# Patient Record
Sex: Female | Born: 1980
Health system: Southern US, Community
[De-identification: ages and names within clinical notes are randomized; demographics above are authoritative.]

## PROBLEM LIST (undated history)

## (undated) DIAGNOSIS — L509 Urticaria, unspecified: Secondary | ICD-10-CM

## (undated) DIAGNOSIS — I639 Cerebral infarction, unspecified: Secondary | ICD-10-CM

## (undated) DIAGNOSIS — T783XXA Angioneurotic edema, initial encounter: Secondary | ICD-10-CM

## (undated) HISTORY — DX: Urticaria, unspecified: L50.9

## (undated) HISTORY — DX: Cerebral infarction, unspecified: I63.9

## (undated) HISTORY — DX: Angioneurotic edema, initial encounter: T78.3XXA

## (undated) HISTORY — PX: BRAIN SURGERY: SHX531

---
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
## 2000-10-08 ENCOUNTER — Other Ambulatory Visit: Admission: RE | Admit: 2000-10-08 | Discharge: 2000-10-08 | Payer: Self-pay | Admitting: Obstetrics and Gynecology

## 2000-11-21 ENCOUNTER — Encounter: Payer: Self-pay | Admitting: Obstetrics and Gynecology

## 2000-11-21 ENCOUNTER — Ambulatory Visit (HOSPITAL_COMMUNITY): Admission: RE | Admit: 2000-11-21 | Discharge: 2000-11-21 | Payer: Self-pay | Admitting: Obstetrics and Gynecology

## 2001-01-06 ENCOUNTER — Other Ambulatory Visit: Admission: RE | Admit: 2001-01-06 | Discharge: 2001-01-06 | Payer: Self-pay | Admitting: Obstetrics and Gynecology

## 2001-04-14 ENCOUNTER — Other Ambulatory Visit: Admission: RE | Admit: 2001-04-14 | Discharge: 2001-04-14 | Payer: Self-pay | Admitting: Internal Medicine

## 2001-11-05 ENCOUNTER — Other Ambulatory Visit: Admission: RE | Admit: 2001-11-05 | Discharge: 2001-11-05 | Payer: Self-pay | Admitting: Obstetrics and Gynecology

## 2004-04-30 ENCOUNTER — Ambulatory Visit: Payer: Self-pay | Admitting: Physical Medicine & Rehabilitation

## 2004-04-30 ENCOUNTER — Inpatient Hospital Stay (HOSPITAL_COMMUNITY): Admission: AC | Admit: 2004-04-30 | Discharge: 2004-05-17 | Payer: Self-pay

## 2004-05-01 ENCOUNTER — Encounter (INDEPENDENT_AMBULATORY_CARE_PROVIDER_SITE_OTHER): Payer: Self-pay | Admitting: Cardiology

## 2004-05-08 ENCOUNTER — Encounter: Payer: Self-pay | Admitting: Cardiology

## 2004-05-17 ENCOUNTER — Inpatient Hospital Stay (HOSPITAL_COMMUNITY)
Admission: RE | Admit: 2004-05-17 | Discharge: 2004-05-25 | Payer: Self-pay | Admitting: Physical Medicine & Rehabilitation

## 2004-05-17 ENCOUNTER — Ambulatory Visit: Payer: Self-pay | Admitting: Physical Medicine & Rehabilitation

## 2004-05-28 ENCOUNTER — Encounter
Admission: RE | Admit: 2004-05-28 | Discharge: 2004-08-26 | Payer: Self-pay | Admitting: Physical Medicine & Rehabilitation

## 2004-07-03 ENCOUNTER — Encounter
Admission: RE | Admit: 2004-07-03 | Discharge: 2004-10-01 | Payer: Self-pay | Admitting: Physical Medicine & Rehabilitation

## 2004-07-09 ENCOUNTER — Ambulatory Visit: Payer: Self-pay | Admitting: Physical Medicine & Rehabilitation

## 2004-08-27 ENCOUNTER — Encounter
Admission: RE | Admit: 2004-08-27 | Discharge: 2004-11-25 | Payer: Self-pay | Admitting: Physical Medicine & Rehabilitation

## 2005-01-16 ENCOUNTER — Other Ambulatory Visit: Admission: RE | Admit: 2005-01-16 | Discharge: 2005-01-16 | Payer: Self-pay | Admitting: Obstetrics and Gynecology

## 2005-07-25 ENCOUNTER — Ambulatory Visit (HOSPITAL_COMMUNITY): Admission: RE | Admit: 2005-07-25 | Discharge: 2005-07-25 | Payer: Self-pay | Admitting: Obstetrics and Gynecology

## 2005-07-29 ENCOUNTER — Inpatient Hospital Stay (HOSPITAL_COMMUNITY): Admission: AD | Admit: 2005-07-29 | Discharge: 2005-07-29 | Payer: Self-pay | Admitting: Obstetrics and Gynecology

## 2005-07-31 ENCOUNTER — Inpatient Hospital Stay (HOSPITAL_COMMUNITY): Admission: AD | Admit: 2005-07-31 | Discharge: 2005-08-05 | Payer: Self-pay | Admitting: Obstetrics and Gynecology

## 2005-08-01 ENCOUNTER — Encounter (INDEPENDENT_AMBULATORY_CARE_PROVIDER_SITE_OTHER): Payer: Self-pay | Admitting: Specialist

## 2007-02-05 ENCOUNTER — Observation Stay (HOSPITAL_COMMUNITY): Admission: AD | Admit: 2007-02-05 | Discharge: 2007-02-06 | Payer: Self-pay | Admitting: Obstetrics and Gynecology

## 2007-02-23 ENCOUNTER — Encounter (INDEPENDENT_AMBULATORY_CARE_PROVIDER_SITE_OTHER): Payer: Self-pay | Admitting: Obstetrics and Gynecology

## 2007-02-23 ENCOUNTER — Inpatient Hospital Stay (HOSPITAL_COMMUNITY): Admission: RE | Admit: 2007-02-23 | Discharge: 2007-02-26 | Payer: Self-pay | Admitting: Obstetrics and Gynecology

## 2009-04-28 IMAGING — US US OB COMP +14 WK
1 series · 14 of 23 positions shown · non-contrast
Comparison: none

OBSTETRICAL ULTRASOUND:

 This ultrasound exam was performed in the [HOSPITAL] Ultrasound Department.  The OB US report was generated in the AS system, and faxed to the ordering physician.  This report is also available in [REDACTED] PACS.

[Series 1: us ob comp +14 wk · 0.30mm/px · 14 of 23 slices shown]
[im 1/23]
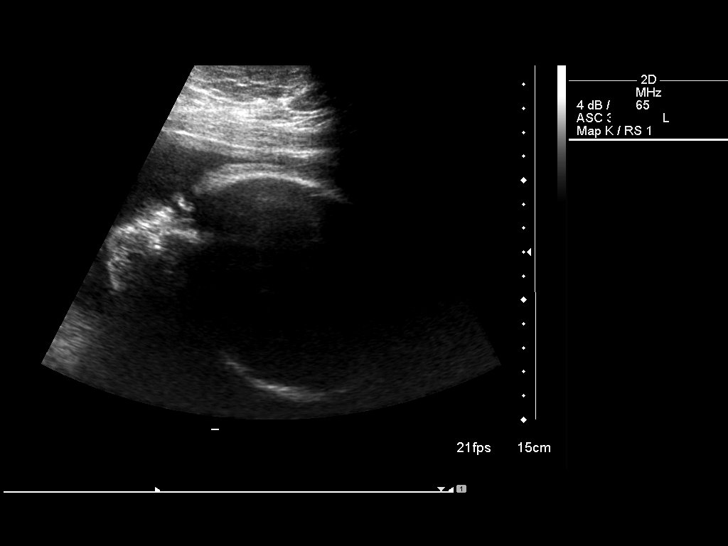
[im 3/23]
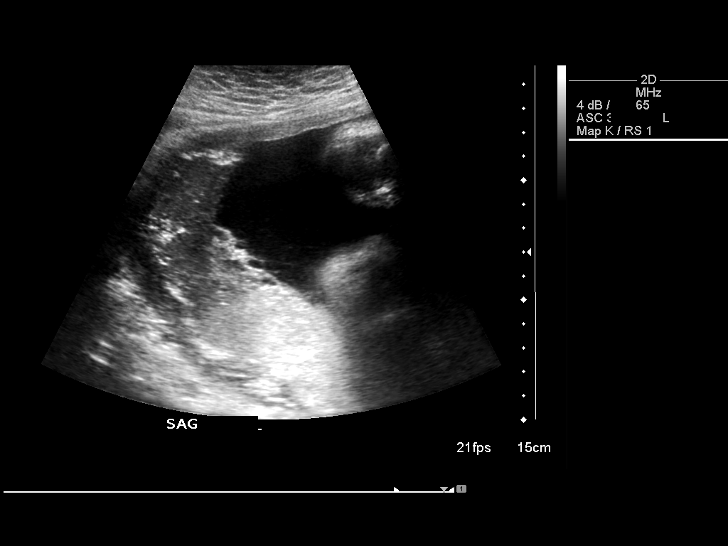
[im 5/23]
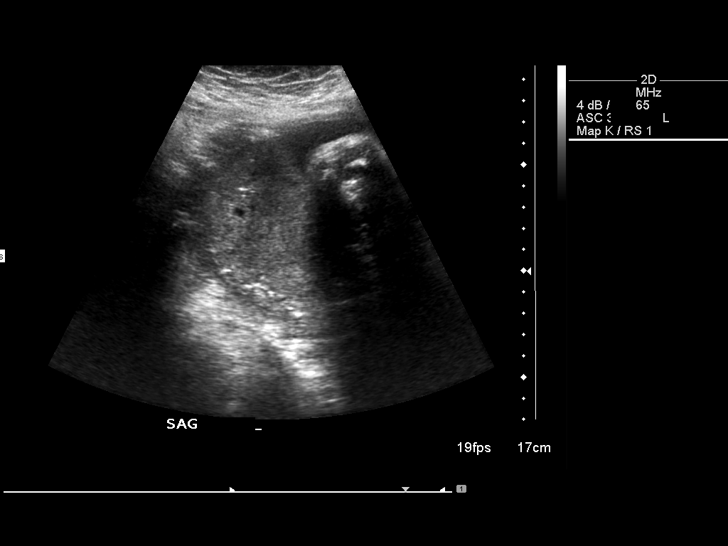
[im 6/23]
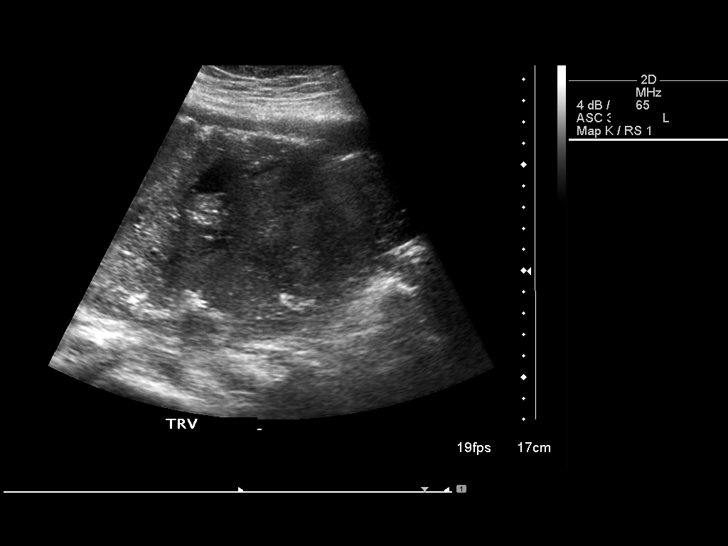
[im 8/23]
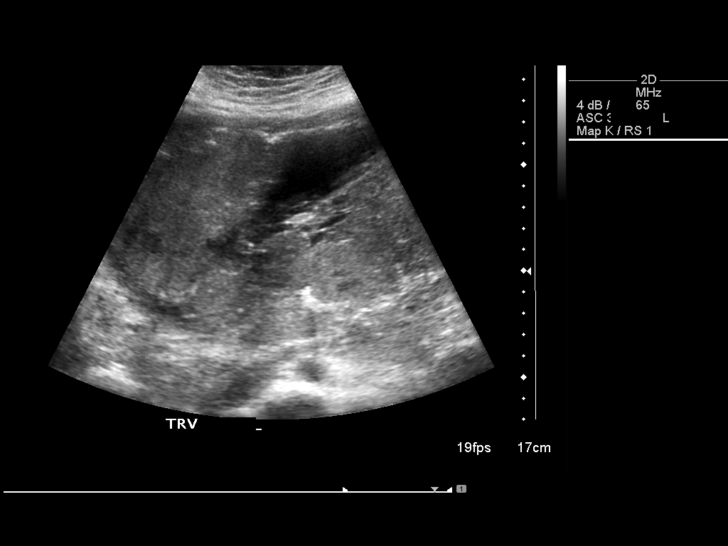
[im 10/23]
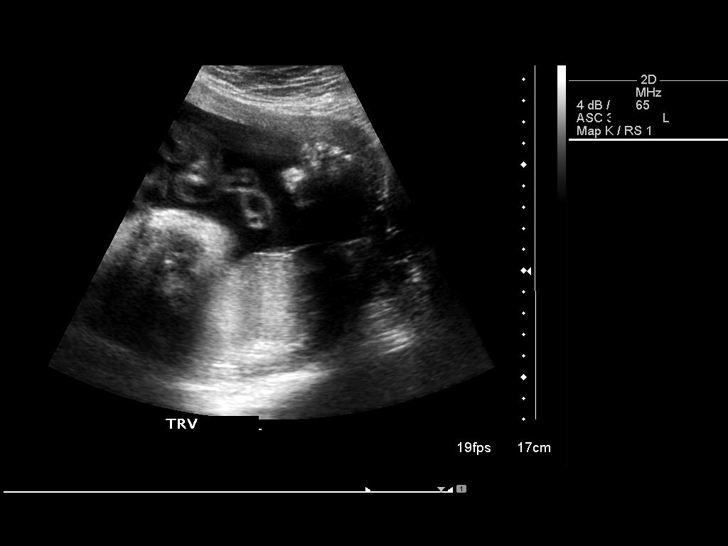
[im 11/23]
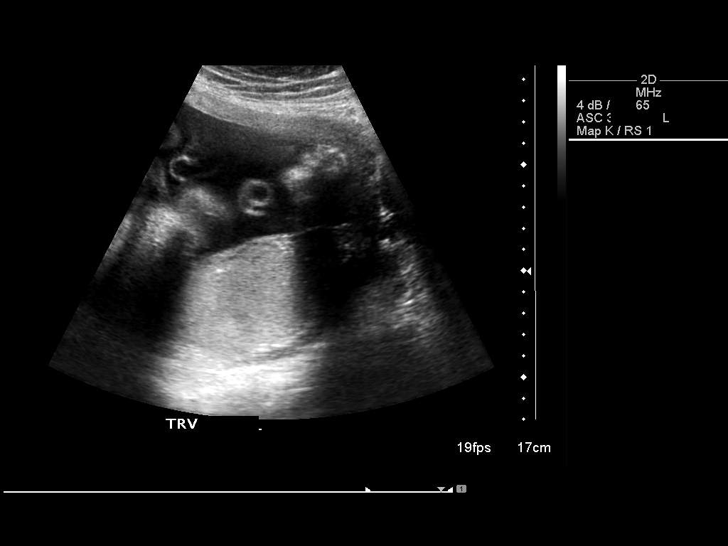
[im 13/23]
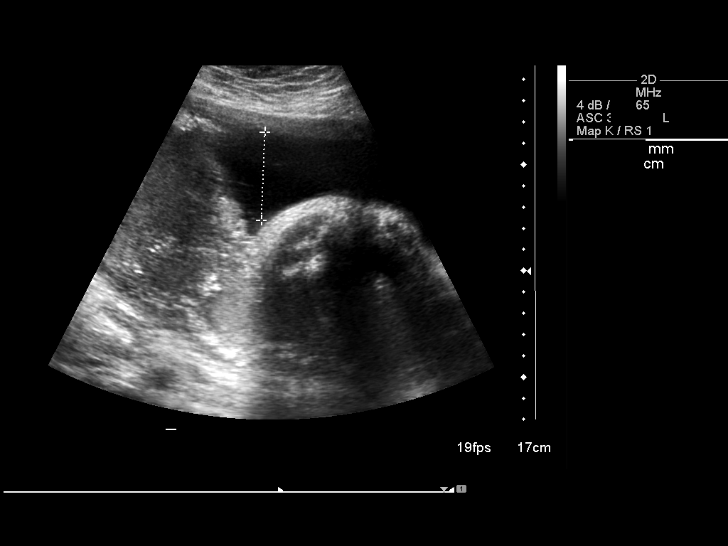
[im 14/23]
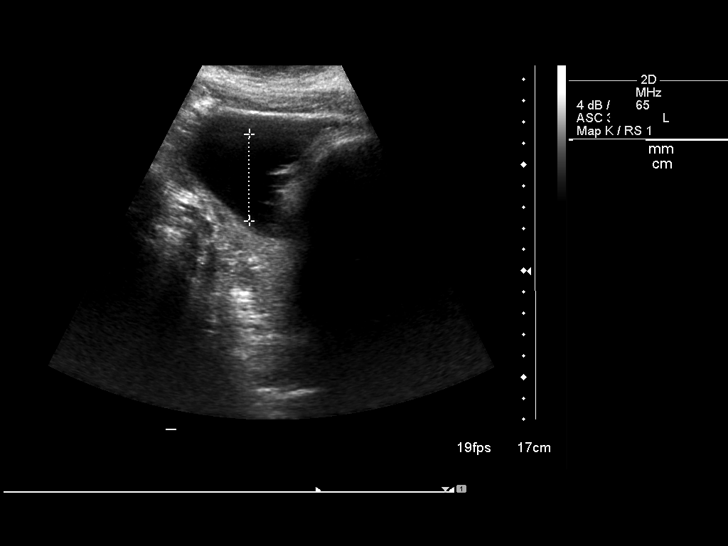
[im 16/23]
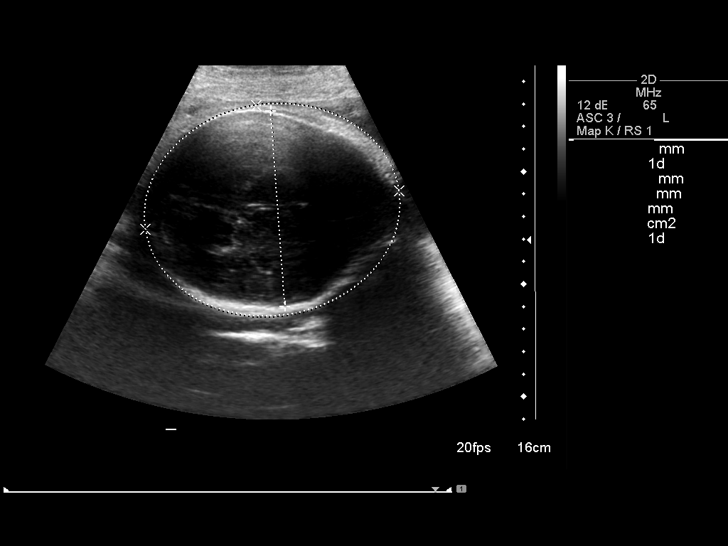
[im 18/23]
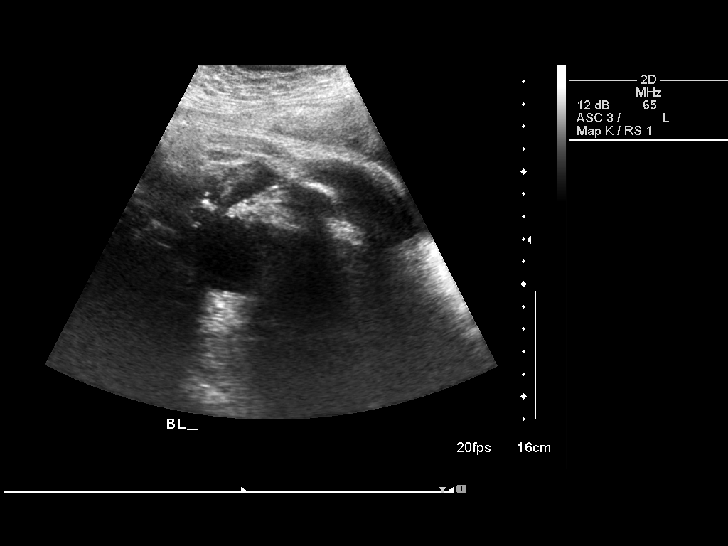
[im 19/23]
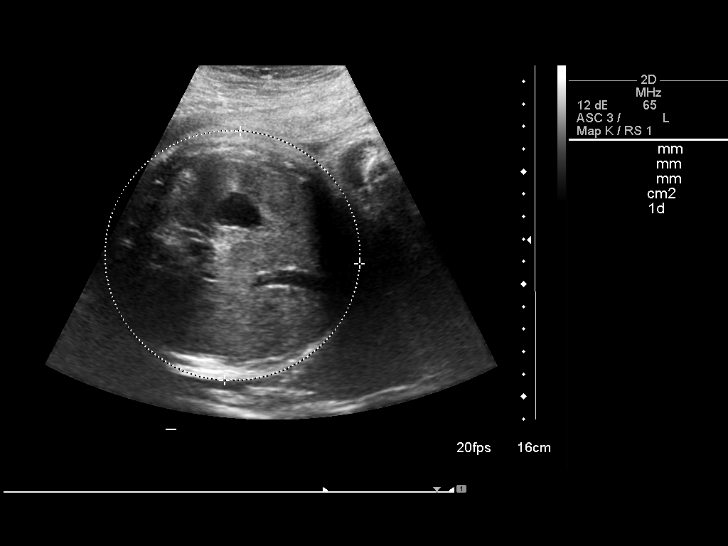
[im 21/23]
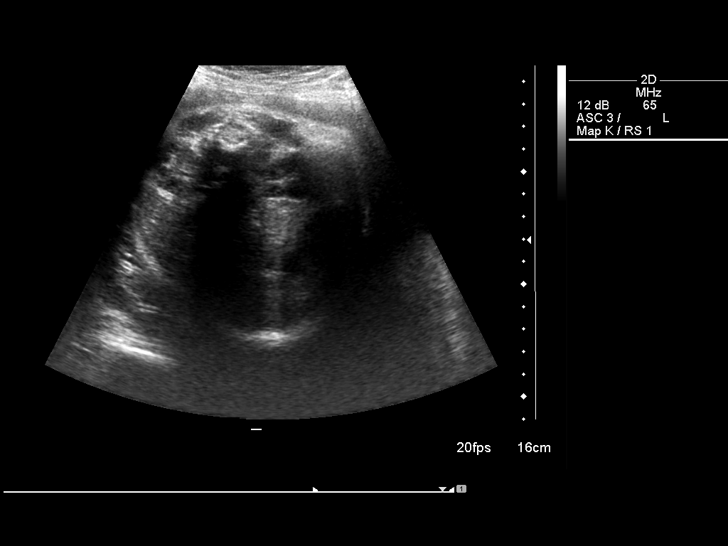
[im 23/23]
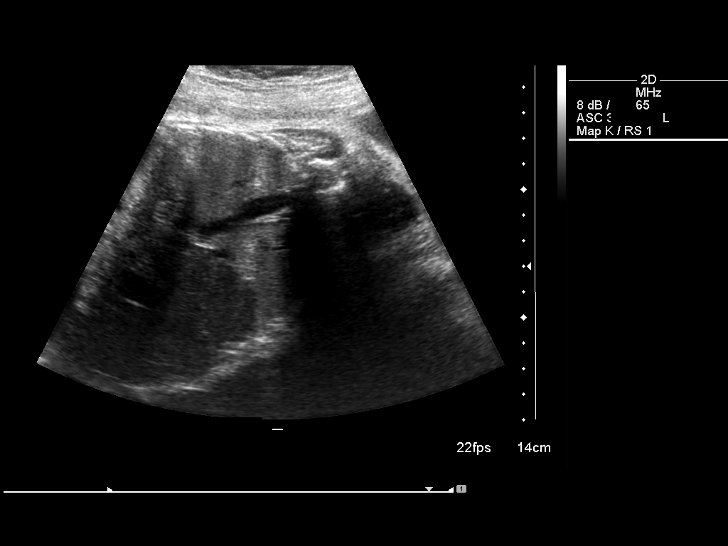

[14 of 23 positions shown; findings below may reference images not displayed]

IMPRESSION: See AS Obstetric US report.

## 2010-09-02 ENCOUNTER — Encounter: Payer: Self-pay | Admitting: Neurosurgery
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
## 2010-12-28 NOTE — Discharge Summary (Signed)
[name], [name]               ACCOUNT NO.:  [id number]   MEDICAL RECORD NO.:  [id number]          PATIENT TYPE:  INP   LOCATION:  9136                          [facility]ACILITY[facility]   PHYSICIAN:  Malachi Pro. Ambrose Mantle, M.D. DATE OF BIRTH:  1981-03-28   DATE OF ADMISSION:  02/23/2007  DATE OF DISCHARGE:  02/26/2007                               DISCHARGE SUMMARY   This is a 30 year old white married female para 1-0-1-1 gravida 3, [facility]  February 28, 2007 admitted for repeat C-section and tubal ligation.  Blood  group and type O+, negative antibody, nonreactive serology, rubella  immune, hepatitis B surface antigen negative, HIV negative, GC and  chlamydia negative.  Pap smear normal.  1-hour Glucola 78.   HOSPITAL COURSE:  The patient underwent a low transverse cervical C-  section and tubal ligation on February 23, 2007.  Postoperatively she did  well, she ambulated well without difficulty, voided well, remained  afebrile, tolerated a diet and on the third postop day she was ready for  discharge. Because her blood pressure remained on the day of discharge  in the 140s over 90s range a PIH panel was done, the blood work is  completely normal with respect a platelet count LDH, SGOT and SGPT. We  are waiting on the cath urine free for protein to decide if she has  preeclampsia or just pregnancy-induced high blood pressure.  Initial  hemoglobin 11.6, hematocrit 35.2, white count 10,900, platelet count  216,000.  Follow-up hemoglobin 9.2. On the day of discharge the  hemoglobin is stable. The PIH panel on the day of discharge as stated  earlier is normal. RPR is nonreactive and rapid HIV screen is negative.   FINAL DIAGNOSIS:  Intrauterine pregnancy at term delivered vertex by C-  section. The delivery was done with vacuum assistance and the vacuum was  actually partially over the baby's forehead. Voluntary sterilization.   OPERATION:  Low transverse cervical C-section with vacuum assisted  delivery of  the fetal vertex, bilateral tubal ligation.   FINAL CONDITION:  Improved.  Instructions include our regular discharge  instruction booklet.  The patient is advised to call with any severe  headache or any significant problems.  Darvocet-N 100 24 tablets one  every 4-6 hours needed for pain is given at discharge. She is to return  in 7-10 days for follow-up examination.      Malachi Pro. Ambrose Mantle, M.D.  Electronically Signed     TFH/MEDQ  D:  02/26/2007  T:  02/26/2007  Job:  [id number]
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
## 2010-12-28 NOTE — Op Note (Signed)
NAMEJAICE, LAGUE               ACCOUNT NO.:  [id number]   MEDICAL RECORD NO.:  [id number]          PATIENT TYPE:  INP   LOCATION:  9136                          FACILITY:  [facility]   PHYSICIAN:  Malachi Pro. Ambrose Mantle, M.D. DATE OF BIRTH:  Jul 30, 1981   DATE OF PROCEDURE:  02/23/2007  DATE OF DISCHARGE:                               OPERATIVE REPORT   PREOPERATIVE DIAGNOSIS:  Intrauterine pregnancy 39 weeks prior C-  section, declined VBAC, voluntary sterilization.   POSTOPERATIVE DIAGNOSIS:  Intrauterine pregnancy 39 weeks prior C-  section, declined VBAC, voluntary sterilization.  With dense adhesions  of the uterus to the lower anterior abdominal wall.  No anterior cul-de-  sac present.   OPERATION:  Low-transverse cervical C-section, lysis of adhesions,  bilateral tubal ligation.   OPERATOR:  Dr. Ambrose Mantle   ASSISTANT:  Dr. Senaida Ores   ANESTHESIA:  Spinal anesthesia   DESCRIPTION OF PROCEDURE:  The patient was brought to the operating room  and given a spinal anesthetic by Dr. Tacy Dura, placed in the left lateral  tilt position.  Fetal heart tones were normal.  The abdomen was prepped  with Betadine solution and was pulled up to the ether screen with tape  to aid in visualization. The urethra was prepped with a Foley catheter  was inserted to straight drain.  The abdomen was draped as a sterile  field. After anesthesia was confirmed by pinching the lower abdomen with  Allis clamp.  A low transverse incision was made through the old scar  and carried in layers through the thick abdominal wall to the fascia.  The fascia was then incised transversely and separated from the rectus  muscle as well as could be even though there was significant scarring  present, then identified the midline and entered the peritoneal cavity  at which time I noted that there were dense adhesions both on the right  and the left lower uterine segment to the abdominal wall.  These were  divided mainly by  electrical current and this aided in visualization of  the lower uterine segment. A retractor was used to visualize the lower  uterine segment, incision was made into the myometrium and then with my  finger I was able to get into the amniotic sac.  Clear fluid was  obtained, a large amount of clear fluid was present.  The incision was  enlarged transversely with bandage scissors and then again with my  fingers the vertex was well below the incision but did not come up  through the incisional opening well. After it was seen that we were  unable to deliver the baby in the routine manner.  I placed a mushroom  vacuum on the baby's head and this popped off.  We then placed the  vacuum.  The self controlled kiwi vacuum and Dr. Senaida Ores enlarged the  incision by cutting part of the left rectus muscle.  The baby did come  through the incisional opening and it was apparent that the vacuum had  actually been placed on the upper part of the baby's left forehead and  the infant delivered  without any difficulty.  The cord was clamped and  the infant was given to Dr. Venetia Constable who was in attendance and she  assigned the 8 pounds 5 ounces female infant Apgars of 8 at 1 and 9 at 5  minutes.  The placenta was removed, the uterine cervix was dilated with  a ring forceps.  The inside of the uterus felt to be free of debris and  the uterine incision was closed with two running sutures of 0 Vicryl  locking the first layer, nonlocking on the second layer. Both tubes and  ovaries appeared normal as did the uterus and additional suture of 3-0  Vicryl was used for complete hemostasis on the uterine incision.  A  small incision was made in the mesosalpinx bilaterally two ties of 0  plain catgut were placed proximally and distally on each tube and the  segment of tube intervening was removed.  There was no bleeding.  The  uterus was replaced into the abdominal cavity.  We sought for  hemostasis.  I inspected the  bladder very carefully to make sure that  dividing the adhesions had not caused any injury to the bladder and I  saw none. I then closed the abdominal wall with a running suture of 0  Vicryl on the peritoneum. A couple of interrupted sutures on the rectus  muscle.  I was not able to reconnect the left rectus muscle that had  been cut.  I then closed the fascia with two running sutures of 0  Vicryl. The exact limits of the fascia on the left were not clear so I  closed what I thought was the fascia and I imbricated an layer of what  was probably scar tissue above that. The subcu tissue was closed with a  3-0 Vicryl.  Skin was closed with automatic staples and the patient was  returned to recovery in satisfactory condition.  Blood loss was about  1000 mL.  Urine was clear.      Malachi Pro. Ambrose Mantle, M.D.  Electronically Signed     TFH/MEDQ  D:  02/23/2007  T:  02/23/2007  Job:  [id number]
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
## 2010-12-28 NOTE — Op Note (Signed)
[name], [name]               ACCOUNT NO.:  [id number]   MEDICAL RECORD NO.:  [id number]          PATIENT TYPE:  INP   LOCATION:  9111                          [facility]   PHYSICIAN:  Huel Cote, M.D. DATE OF BIRTH:  1981/05/20   DATE OF PROCEDURE:  08/01/2005  DATE OF DISCHARGE:                                 OPERATIVE REPORT   PREOPERATIVE DIAGNOSIS:  1.  Term pregnancy at 40+ weeks, delivered.  2.  Pregnancy induced hypertension.  3.  Suspected macrosomia.  4.  Arrest of dilation.   POSTOPERATIVE DIAGNOSIS:  1.  Term pregnancy at 40+ weeks, delivered.  2.  Pregnancy induced hypertension.  3.  Suspected macrosomia.  4.  Arrest of dilation.   PROCEDURE:  Primary low transverse cesarean section with double layer  closure.   SURGEON:  Dr. Huel Cote.   ASSISTANT:  Dr. Tracey Harries.   ANESTHESIA:  Epidural.   FINDINGS:  There is a viable female infant in the vertex presentation Apgars  were 08/09. Weight was pending at time of dictation. There was meconium-  stained fluid and a nuchal cord x1 on the infant. Infant was DeLee  suctioned. The uterus, tubes and ovaries were normal.   PROCEDURE:  The patient was taken to the operating room where epidural  anesthesia was initially found to be inadequate on the left side. Therefore  after pulling back catheter and redosing the patient, she did achieve an  adequate epidural anesthetic. 1% lidocaine was also injected into the skin  for additional anesthesia. The scalpel was then used to make a Pfannenstiel  incision and this was carried through to the underlying layer of fascia by  sharp dissection and Bovie cautery. The peritoneum was then taken down to  level of fascia by sharp dissection and Bovie cautery. The fascia was then  nicked in the midline and the incision was extended laterally with Mayo  scissors. The inferior aspect of the incision was then grasped with Kocher  clamps, elevated and dissected  off the rectus muscles. The superior aspect  was likewise elevated and dissected off the rectus muscles. Rectus muscles  were separated in midline and the incision was extended both superiorly and  inferiorly once the peritoneal cavity had been entered bluntly. There was a  significant amount of peritoneal edema noted. The large wound retractor self-  retaining retractor was then placed within the incision and the skin and  fascia retracted. The lower uterine segment was then exposed nicely. This  was incised in transverse fashion with the scalpel and the cavity itself was  entered bluntly. There was moderate meconium-stained fluid noted and the  infant's head was then delivered atraumatically. The infant was both DeLee  suctioned and bulb suctioned after delivery of the head. There was a nuchal  cord x1 which was reduced. The remainder of the infant's body delivered  atraumatically. The cord was clamped, cut and infant was handed to the  waiting pediatricians. The patient's cord blood was then obtained and the  placenta sent for cord blood harvesting and then to go to pathology as the  amniotic fluid did appear somewhat foul-smelling. The uterus was then  evacuated of all clots and debris with moist lap sponge. It was slightly  boggy but this responded well to bimanual massage and Pitocin. The uterine  incision was then closed in two layers. The first a running locked layer of  0 chromic. A second an imbricating layer of the same. At this point there  was good hemostasis noted. The ovaries and tubes were inspected and found be  hemostatic. The incision was hemostatic. The gutters cleared of all clots,  debris with moist lap sponge and all instruments and sponges were then  removed from the abdomen. The bladder flap was inspected and though quite  edematous, it did not appear that the bladder approached level of the  incision. The patient did have bloody urine noted. However, this had been   present in the labor and delivery room prior to C-section. At this point Dr.  Ambrose Mantle finished the closure. He put several interrupted sutures of 0 Vicryl  into the rectus muscle itself. Fascia was then closed with 0 Vicryl in  running fashion and the skin was closed with staples. Sponge, lap and needle  counts were correct x2 and the patient was taken to the recovery room in  stable condition. Estimated blood loss was essentially 1000 mL and the urine  will be watched for clearing.      Huel Cote, M.D.  Electronically Signed     KR/MEDQ  D:  08/01/2005  T:  08/02/2005  Job:  [id number]
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
## 2010-12-28 NOTE — Discharge Summary (Signed)
[name], OTTAWAY               ACCOUNT NO.:  [id number]   MEDICAL RECORD NO.:  [id number]          PATIENT TYPE:  INP   LOCATION:  9111                          FACILITY:  [facility]   PHYSICIAN:  Malachi Pro. Ambrose Mantle, M.D. DATE OF BIRTH:  1981-06-21   DATE OF ADMISSION:  07/31/2005  DATE OF DISCHARGE:  08/05/2005                                 DISCHARGE SUMMARY   A 30 year old white female, para 0-0-1-0, gravida 2, blood group and type O  positive, negative antibody, non-reactive serology, rubella non-immune,  hepatitis B surface antigen negative, HIV negative, GC and chlamydia  negative, group B strep negative, abnormal Pap smear, and one-hour Glucola  100.  The patient was admitted.  Her prenatal course is as stated in the  history and physical.  The patient was admitted in early labor.  She was 3  to 4 cm at 10:45 p.m. on July 31, 2005.  By midnight, she was 4 to 5 cm.  By 6:10 a.m., she was 8 cm.  By 8:30 a.m., she was a rim dilated.  She  underwent a low transverse cervical C-section by Dr. Senaida Ores with Dr.  Ambrose Mantle assisting, under epidural anesthesia, with a female infant weighing 8  pounds 8 ounces, with Apgars of 6 at one minute and 9 at five minutes.  Blood loss about 1000 mL.  Postoperatively, the patient had two main  problems:  elevated temperature and intermittent elevation of blood  pressure.  Dr. Senaida Ores felt she had residual endometritis and  chorioamnionitis.  She was on clindamycin.  On the second postop day, she  was kept on clindamycin and then she did develop a temperature elevation to  100.7 on the evening of the second postop day.  Dr. Jackelyn Knife was not  notified.  When he noticed this on the morning of the third postop day, he  added gentamycin, and on the forth postop day she had been afebrile for 36  hours.  Her blood pressures continued to be intermittently elevated, but no  worse.  The white count had decreased to 14,100.  The patient was ambulating  well without difficulty, voiding well.  She had had a bowel movement.  Her  incision was well healed.  Staples removed.  Strips were applied and she was  ready for discharge.  Multiple PIH panels showed hemoglobin of initial 11.9,  it drifted down to 8.9 at discharge.  Her white count on admission was  15,000, went as high as 26.3, on the morning of discharge was 14.1.  All  platelet counts were normal, all liver function tests were normal.  The only  abnormality that was consistent was a uric acid, which was 7.9, 8.9, and  8.3.  Her urinalysis on admission did show 100 mg% protein.  RPR was non-  reactive.   FINAL DIAGNOSES:  1.  Intrauterine pregnancy at 40+ weeks with relative cephalopelvic      disproportion.  2.  Pregnancy-induced hypertension versus preeclampsia.  3.  Chorioamnionitis.  4.  Endometritis.   OPERATION:  Low transverse cervical C-section.   FINAL CONDITION:  Improved.  INSTRUCTIONS:  Include our regular discharge instruction booklet.  The  patient is advised to call with any temperature elevation above 100.4  degrees.  She is to take her temperature four times a day.  She is to return  to the office in 10 days.  Darvocet N-100, 24 tablets, one every 4-6 hours  as needed for pain is given at discharge.      Malachi Pro. Ambrose Mantle, M.D.  Electronically Signed     TFH/MEDQ  D:  08/05/2005  T:  08/05/2005  Job:  [id number]

## 2011-05-27 LAB — CBC
HCT: 27.8 — ABNORMAL LOW
HCT: 30 — ABNORMAL LOW
MCHC: 33.2
MCV: 80.1
MCV: 80.3
Platelets: 176
Platelets: 257
RBC: 3.74 — ABNORMAL LOW
WBC: 11.7 — ABNORMAL HIGH

## 2011-05-27 LAB — URINALYSIS, ROUTINE W REFLEX MICROSCOPIC
Bilirubin Urine: NEGATIVE
Hgb urine dipstick: NEGATIVE
Ketones, ur: NEGATIVE
Nitrite: NEGATIVE
Specific Gravity, Urine: <1.005 — ABNORMAL LOW
Urobilinogen, UA: 0.2

## 2011-05-27 LAB — COMPREHENSIVE METABOLIC PANEL
ALT: 19
Alkaline Phosphatase: 125 — ABNORMAL HIGH
BUN: 5 — ABNORMAL LOW
CO2: 24
Chloride: 109
GFR calc non Af Amer: >60
Glucose, Bld: 80
Potassium: 3.9
Sodium: 139
Total Bilirubin: 0.8
Total Protein: 5.2 — ABNORMAL LOW

## 2011-05-27 LAB — URIC ACID: Uric Acid, Serum: 8 — ABNORMAL HIGH

## 2011-05-27 LAB — LACTATE DEHYDROGENASE: LDH: 225

## 2011-05-28 LAB — COMPREHENSIVE METABOLIC PANEL
ALT: 13
AST: 22
Albumin: 2.8 — ABNORMAL LOW
Alkaline Phosphatase: 200 — ABNORMAL HIGH
BUN: 6
Chloride: 106
Potassium: 4.1
Sodium: 137
Total Bilirubin: 0.9

## 2011-05-28 LAB — DIFFERENTIAL
Basophils Absolute: 0
Basophils Relative: 0
Eosinophils Absolute: 0
Eosinophils Relative: 0
Monocytes Absolute: 0.7
Neutro Abs: 8.4 — ABNORMAL HIGH

## 2011-05-28 LAB — CBC
MCHC: 32.9
MCV: 79.8
RBC: 4.41
RDW: 14.9 — ABNORMAL HIGH

## 2011-05-29 LAB — CBC
HCT: 34.7 — ABNORMAL LOW
MCHC: 32.8
MCV: 82.1
Platelets: 213
RDW: 14.7 — ABNORMAL HIGH

## 2011-05-29 LAB — RPR: RPR Ser Ql: NONREACTIVE

## 2012-02-04 ENCOUNTER — Emergency Department (HOSPITAL_COMMUNITY)
Admission: EM | Admit: 2012-02-04 | Discharge: 2012-02-04 | Disposition: A | Discharge status: Home / Self Care | Payer: BC Managed Care – PPO | Attending: Emergency Medicine | Admitting: Emergency Medicine

## 2012-02-04 ENCOUNTER — Encounter (HOSPITAL_COMMUNITY): Payer: Self-pay | Admitting: *Deleted

## 2012-02-04 ENCOUNTER — Emergency Department (HOSPITAL_COMMUNITY): Payer: BC Managed Care – PPO

## 2012-02-04 DIAGNOSIS — R51 Headache: Secondary | ICD-10-CM | POA: Insufficient documentation

## 2012-02-04 MED ORDER — KETOROLAC TROMETHAMINE 60 MG/2ML IM SOLN
INTRAMUSCULAR | Status: AC
  Administered 2012-02-04: 60 mg via INTRAMUSCULAR
  Filled 2012-02-04: qty 2

## 2012-02-04 MED ORDER — KETOROLAC TROMETHAMINE 60 MG/2ML IM SOLN
60.0000 mg | Freq: Once | INTRAMUSCULAR | Status: AC
  Administered 2012-02-04: 60 mg via INTRAMUSCULAR

## 2012-02-04 NOTE — ED Provider Notes (Signed)
History     CSN: [id number]  Arrival date & time 02/04/12  7829   First MD Initiated Contact with Patient 02/04/12 1008      Chief Complaint  Patient presents with  . Headache    (Consider location/radiation/quality/duration/timing/severity/associated sxs/prior treatment) The history is provided by the patient.  Pt is a 31 y/o F s/p TBI with craniectomy and cerebellar microdissection 8 years ago by Dr Lovell Sheehan who presents to ED with c/c HA x 3 days. Pt reports frequent similar HA since injury that typically resolve within an hour or so. Described as constant, sharp, with occasional spasms described as "shocks" to the right parietal/superior occipital region. There is no assoc visual change, tinnitus, trouble speaking, difficulty moving extremities, new gait instability (chronic unsteady gait since injury, unchanged). Denies fever, neck stiffness, rash. There has been no new head injury. She has taken tylenol without relief. Was seen by PCP yesterday with referral to see Dr Lovell Sheehan, which has not yet been processed.   History reviewed. No pertinent past medical history.  Past Surgical History  Procedure Date  . Brain surgery   . Cesarean section     No family history on file.  History  Substance Use Topics  . Smoking status: Never Smoker   . Smokeless tobacco: Not on file  . Alcohol Use: No     Review of Systems 10 systems reviewed and are negative for acute change except as noted in the HPI.  Allergies  Morphine and related and Vicodin  Home Medications   Current Outpatient Rx  Name Route Sig Dispense Refill  . ACETAMINOPHEN 325 MG PO TABS Oral Take 650 mg by mouth every 6 (six) hours as needed. For pain    . OVER THE COUNTER MEDICATION Oral Take 1 tablet by mouth daily.      BP 112/66  Pulse 67  Temp 98 F (36.7 C) (Oral)  Resp 18  Ht 5' (1.524 m)  Wt 140 lb (63.504 kg)  BMI 27.34 kg/m2  SpO2 100%  LMP 01/31/2012  Physical Exam  Nursing note  reviewed. Constitutional: She is oriented to person, place, and time. She appears well-developed and well-nourished. No distress.       Vital signs are reviewed and are normal.   HENT:  Head: Normocephalic and atraumatic.       Right parietal scalp tender to palpation  Eyes: Conjunctivae and EOM are normal. Pupils are equal, round, and reactive to light.       Visual fields full. No nystagmus.  Neck: Neck supple.       Full ROM without pain  Cardiovascular: Normal rate and regular rhythm.        Bilateral radial and DP pulses are 2+   Pulmonary/Chest: Effort normal and breath sounds normal. No respiratory distress.  Abdominal: She exhibits no distension.  Neurological: She is alert and oriented to person, place, and time. She has normal strength. No cranial nerve deficit (3-12 intact) or sensory deficit (intact to light touch in all extremities distally). She displays a negative Romberg sign. Coordination (F-N intact bilaterally) normal. GCS eye subscore is 4. GCS verbal subscore is 5. GCS motor subscore is 6.       Slight imbalance on gait testing, no ataxia- baseline per pt.  Skin: Skin is warm and dry. No rash noted.  Psychiatric: She has a normal mood and affect.    ED Course  Procedures (including critical care time)  Labs Reviewed - No data to display Ct  Head Wo Contrast  02/04/2012  *RADIOLOGY REPORT*  Clinical Data: Right sided headache, dizziness, prior brain surgery  CT HEAD WITHOUT CONTRAST  Technique:  Contiguous axial images were obtained from the base of the skull through the vertex without contrast.  Comparison: 05/16/2004  Findings: Encephalomalacic changes in the left cerebellum (series 2/image 8) and posterior right parietal lobe (series 2/image 18). Additional suspected prior infarct in the right cerebellum (series 2/image 5).  Postsurgical changes at the left skull base.  No evidence of parenchymal hemorrhage or extra-axial fluid collection. No mass lesion, mass effect,  or midline shift.  No CT evidence of acute infarction.  Cerebral volume is age appropriate.  No ventriculomegaly.  Septum cavum pellucidum.  The visualized paranasal sinuses are essentially clear. The mastoid air cells are unopacified.  No evidence of calvarial fracture.  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  Postsurgical changes at the left skull base.  No evidence of acute intracranial abnormality.  Original Report Authenticated By: Charline Bills, M.D.     Dx 1: Headache   MDM  Chronic HA more persistent over last 3 days s/p craniectomy and cerebellar microdissection 8 years ago. Scalp TTP. Neuro and motor exam without gross findings aside from baseline slight gait instability. Doubt SAH, SDH, or other ICH. Not c/w meningitis. No temporal TTP to suggest temporal arteritis. CT head reassuring with no acute findings. Discussed with pt and family that they should continue outpatient f/u as previously planned. Warning signs to prompt ED re-eval discussed, pt and family voice understanding.        Shaaron Adler, [place] 02/04/12 1222

## 2012-02-04 NOTE — ED Notes (Signed)
Pt remains in CT Scan- will give pain medication when returns.

## 2012-02-04 NOTE — Discharge Instructions (Signed)
You can take ibuprofen as needed for pain every 8 hours. Return to the ER for re-evaluation if you develop any of the warning signs we discussed.     Headache, General, Unknown Cause The specific cause of your headache may not have been found today. There are many causes and types of headache. A few common ones are:  Tension headache.   Migraine.   Infections (examples: dental and sinus infections).   Bone and/or joint problems in the neck or jaw.   Depression.   Eye problems.  These headaches are not life threatening.  Headaches can sometimes be diagnosed by a patient history and a physical exam. Sometimes, lab and imaging studies (such as x-ray and/or CT scan) are used to rule out more serious problems. In some cases, a spinal tap (lumbar puncture) may be requested. There are many times when your exam and tests may be normal on the first visit even when there is a serious problem causing your headaches. Because of that, it is very important to follow up with your doctor or local clinic for further evaluation. FINDING OUT THE RESULTS OF TESTS  If a radiology test was performed, a radiologist will review your results.   You will be contacted by the emergency department or your physician if any test results require a change in your treatment plan.   Not all test results may be available during your visit. If your test results are not back during the visit, make an appointment with your caregiver to find out the results. Do not assume everything is normal if you have not heard from your caregiver or the medical facility. It is important for you to follow up on all of your test results.  HOME CARE INSTRUCTIONS   Keep follow-up appointments with your caregiver, or any specialist referral.   Only take over-the-counter or prescription medicines for pain, discomfort, or fever as directed by your caregiver.   Biofeedback, massage, or other relaxation techniques may be helpful.   Ice packs  or heat applied to the head and neck can be used. Do this three to four times per day, or as needed.   Call your doctor if you have any questions or concerns.   If you smoke, you should quit.  SEEK MEDICAL CARE IF:   You develop problems with medications prescribed.   You do not respond to or obtain relief from medications.   You have a change from the usual headache.   You develop nausea or vomiting.  SEEK IMMEDIATE MEDICAL CARE IF:   If your headache becomes severe.   You have an unexplained oral temperature above 102 F (38.9 C), or as your caregiver suggests.   You have a stiff neck.   You have loss of vision.   You have muscular weakness.   You have loss of muscular control.   You develop severe symptoms different from your first symptoms.   You start losing your balance or have trouble walking.   You feel faint or pass out.  MAKE SURE YOU:   Understand these instructions.   Will watch your condition.   Will get help right away if you are not doing well or get worse.  Document Released: 07/29/2005 Document Revised: 07/18/2011 Document Reviewed: 03/17/2008 [facility] Patient Information 2012 [place], [place].

## 2012-02-04 NOTE — ED Provider Notes (Signed)
Medical screening examination/treatment/procedure(s) were performed by non-physician practitioner and as supervising physician I was immediately available for consultation/collaboration.  Flint Melter, MD 02/04/12 1719

## 2012-02-04 NOTE — ED Notes (Signed)
Pt reports hx of brain surgery- cerebella removed x 8 years ago. States has had muscle spasms/sharp pain in brain since, but this am pain has not let up.  Pain lasting x 3 days. Pain to right side of head. Saw PCP yesterday, was going to schedule CT Scan, but pain increased.

## 2012-02-04 NOTE — ED Notes (Signed)
Patient transported to X-ray 

## 2012-02-25 ENCOUNTER — Encounter (HOSPITAL_COMMUNITY): Payer: Self-pay

## 2012-02-25 ENCOUNTER — Observation Stay (HOSPITAL_COMMUNITY)
Admission: EM | Admit: 2012-02-25 | Discharge: 2012-02-26 | Disposition: A | Discharge status: Home / Self Care | Payer: BC Managed Care – PPO | Attending: Family Medicine | Admitting: Family Medicine

## 2012-02-25 DIAGNOSIS — T6391XA Toxic effect of contact with unspecified venomous animal, accidental (unintentional), initial encounter: Principal | ICD-10-CM | POA: Insufficient documentation

## 2012-02-25 DIAGNOSIS — T63001A Toxic effect of unspecified snake venom, accidental (unintentional), initial encounter: Secondary | ICD-10-CM | POA: Insufficient documentation

## 2012-02-25 DIAGNOSIS — T63121A Toxic effect of venom of other venomous lizard, accidental (unintentional), initial encounter: Secondary | ICD-10-CM

## 2012-02-25 DIAGNOSIS — T63004A Toxic effect of unspecified snake venom, undetermined, initial encounter: Secondary | ICD-10-CM | POA: Diagnosis present

## 2012-02-25 DIAGNOSIS — Y92009 Unspecified place in unspecified non-institutional (private) residence as the place of occurrence of the external cause: Secondary | ICD-10-CM | POA: Insufficient documentation

## 2012-02-25 LAB — D-DIMER, QUANTITATIVE: D-Dimer, Quant: 0.33 ug/mL-FEU (ref 0.00–0.48)

## 2012-02-25 LAB — FIBRINOGEN: Fibrinogen: 287 mg/dL (ref 204–475)

## 2012-02-25 LAB — PROTIME-INR
INR: 1.05 (ref 0.00–1.49)
Prothrombin Time: 13.9 seconds (ref 11.6–15.2)

## 2012-02-25 LAB — APTT: aPTT: 27 seconds (ref 24–37)

## 2012-02-25 LAB — CK: Total CK: 119 U/L (ref 7–177)

## 2012-02-25 MED ORDER — SODIUM CHLORIDE 0.9 % IV SOLN
4.0000 | Freq: Once | INTRAVENOUS | Status: AC
  Administered 2012-02-26: 40 mL via INTRAVENOUS
  Filled 2012-02-25: qty 40

## 2012-02-25 MED ORDER — HYDROMORPHONE HCL PF 1 MG/ML IJ SOLN
1.0000 mg | Freq: Once | INTRAMUSCULAR | Status: AC
  Administered 2012-02-25: 1 mg via INTRAVENOUS
  Filled 2012-02-25: qty 1

## 2012-02-25 MED ORDER — ONDANSETRON HCL 4 MG/2ML IJ SOLN
4.0000 mg | Freq: Once | INTRAMUSCULAR | Status: AC
  Administered 2012-02-25: 4 mg via INTRAVENOUS
  Filled 2012-02-25: qty 2

## 2012-02-25 MED ORDER — SODIUM CHLORIDE 0.9 % IV SOLN
INTRAVENOUS | Status: DC
  Administered 2012-02-26: 01:00:00 via INTRAVENOUS
  Administered 2012-02-26: 100 mL/h via INTRAVENOUS

## 2012-02-25 MED ORDER — SODIUM CHLORIDE 0.9 % IV BOLUS (SEPSIS)
1000.0000 mL | Freq: Once | INTRAVENOUS | Status: AC
  Administered 2012-02-25: 1000 mL via INTRAVENOUS

## 2012-02-25 MED ORDER — TETANUS-DIPHTH-ACELL PERTUSSIS 5-2.5-18.5 LF-MCG/0.5 IM SUSP
0.5000 mL | Freq: Once | INTRAMUSCULAR | Status: AC
  Administered 2012-02-26: 0.5 mL via INTRAMUSCULAR
  Filled 2012-02-25: qty 0.5

## 2012-02-25 MED ORDER — CROTALIDAE POLYVAL IMMUNE FAB IV SOLR: 4.0000 | Freq: Once | INTRAVENOUS | Status: DC

## 2012-02-25 NOTE — ED Notes (Signed)
Spoke to [place] with [employer]; instructed to elevate extremity with knee extended as much as possible; labwork 6 hrs post snake bite (PT, INR, CBC, fibrinogen, D-Dimer); pt should be observed for minimum of 6 hrs; if fasciotomy is warranted then toxicologist should be notified prior;  usual dose of Crofab is 4 vials in 250 cc NS - start slowly at 10 cc/hr then gradually increase rate - dose generally is completed within 1 hour; ensure Tetanus is up to date; Crofab fact sheet faxed to [place] - given to EDP; above noted info relayed to EDP as well

## 2012-02-25 NOTE — ED Notes (Addendum)
Pt stated that she was bit by a black and brown snake around 2100. Bitten by a black and brown snake in the back of leg. 2 bite marks are on the back of ankle. Bleeding stopped. Swelling noted in ankle and calf. Bruising noted on the inner ankle. Pedal pulse weak. Leg warm to touch. Pt complaining of tingling in toes radiating to back of knee. Brisk capillary refill. Pain 10 out of 10. No cardiac or respiratory distress. Will continue to monitor. Ankle circumfrence: 27cm. Upper calf circumfrence 36 cm and thigh circumfrence: 47.5 cm Will continue to monitor.

## 2012-02-25 NOTE — ED Notes (Signed)
Poison controlled called  

## 2012-02-26 ENCOUNTER — Encounter (HOSPITAL_COMMUNITY): Payer: Self-pay | Admitting: Family Medicine

## 2012-02-26 DIAGNOSIS — T63004A Toxic effect of unspecified snake venom, undetermined, initial encounter: Secondary | ICD-10-CM | POA: Diagnosis present

## 2012-02-26 LAB — CBC WITH DIFFERENTIAL/PLATELET
Basophils Absolute: 0 10*3/uL (ref 0.0–0.1)
Basophils Relative: 0 % (ref 0–1)
Eosinophils Absolute: 0 10*3/uL (ref 0.0–0.7)
Eosinophils Relative: 0 % (ref 0–5)
HCT: 27.2 % — ABNORMAL LOW (ref 36.0–46.0)
Hemoglobin: 7.9 g/dL — ABNORMAL LOW (ref 12.0–15.0)
Lymphocytes Relative: 12 % (ref 12–46)
Lymphs Abs: 1.9 10*3/uL (ref 0.7–4.0)
MCH: 17.4 pg — ABNORMAL LOW (ref 26.0–34.0)
MCHC: 29 g/dL — ABNORMAL LOW (ref 30.0–36.0)
MCV: 60 fL — ABNORMAL LOW (ref 78.0–100.0)
Monocytes Absolute: 0.8 10*3/uL (ref 0.1–1.0)
Monocytes Relative: 5 % (ref 3–12)
Neutro Abs: 13 10*3/uL — ABNORMAL HIGH (ref 1.7–7.7)
Neutrophils Relative %: 83 % — ABNORMAL HIGH (ref 43–77)
Platelets: 292 10*3/uL (ref 150–400)
RBC: 4.53 MIL/uL (ref 3.87–5.11)
RDW: 18.7 % — ABNORMAL HIGH (ref 11.5–15.5)
WBC: 15.7 10*3/uL — ABNORMAL HIGH (ref 4.0–10.5)

## 2012-02-26 LAB — PROTIME-INR
INR: 1.16 (ref 0.00–1.49)
INR: 1.14 (ref 0.00–1.49)
Prothrombin Time: 14.8 seconds (ref 11.6–15.2)

## 2012-02-26 LAB — DIFFERENTIAL
Basophils Absolute: 0 10*3/uL (ref 0.0–0.1)
Eosinophils Absolute: 0 10*3/uL (ref 0.0–0.7)
Lymphocytes Relative: 7 % — ABNORMAL LOW (ref 12–46)
Lymphs Abs: 1.1 10*3/uL (ref 0.7–4.0)
Neutro Abs: 14.8 10*3/uL — ABNORMAL HIGH (ref 1.7–7.7)

## 2012-02-26 LAB — MRSA PCR SCREENING: MRSA by PCR: NEGATIVE

## 2012-02-26 LAB — CBC
HCT: 27.8 % — ABNORMAL LOW (ref 36.0–46.0)
MCHC: 28.4 g/dL — ABNORMAL LOW (ref 30.0–36.0)
RDW: 18.3 % — ABNORMAL HIGH (ref 11.5–15.5)

## 2012-02-26 LAB — FIBRINOGEN: Fibrinogen: 306 mg/dL (ref 204–475)

## 2012-02-26 MED ORDER — ALUM & MAG HYDROXIDE-SIMETH 200-200-20 MG/5ML PO SUSP
ORAL | Status: AC
  Filled 2012-02-26: qty 30

## 2012-02-26 MED ORDER — HYDROMORPHONE HCL PF 1 MG/ML IJ SOLN
INTRAMUSCULAR | Status: AC
  Filled 2012-02-26: qty 1

## 2012-02-26 MED ORDER — ONDANSETRON HCL 4 MG PO TABS: 4.0000 mg | ORAL_TABLET | Freq: Four times a day (QID) | ORAL | Status: DC | PRN

## 2012-02-26 MED ORDER — ONDANSETRON HCL 4 MG/2ML IJ SOLN: 4.0000 mg | Freq: Four times a day (QID) | INTRAMUSCULAR | Status: DC | PRN

## 2012-02-26 MED ORDER — ONDANSETRON HCL 4 MG/2ML IJ SOLN
4.0000 mg | Freq: Once | INTRAMUSCULAR | Status: AC
  Administered 2012-02-26: 4 mg via INTRAVENOUS

## 2012-02-26 MED ORDER — OXYCODONE-ACETAMINOPHEN 5-325 MG PO TABS: 1.0000 | ORAL_TABLET | Freq: Four times a day (QID) | ORAL | Status: AC | PRN

## 2012-02-26 MED ORDER — HYDROMORPHONE HCL PF 1 MG/ML IJ SOLN
1.0000 mg | Freq: Once | INTRAMUSCULAR | Status: AC
  Administered 2012-02-26: 1 mg via INTRAVENOUS

## 2012-02-26 MED ORDER — OXYCODONE-ACETAMINOPHEN 5-325 MG PO TABS
1.0000 | ORAL_TABLET | Freq: Four times a day (QID) | ORAL | Status: DC | PRN
  Administered 2012-02-26: 2 via ORAL
  Filled 2012-02-26: qty 2

## 2012-02-26 MED ORDER — ONDANSETRON HCL 4 MG PO TABS: 4.0000 mg | ORAL_TABLET | Freq: Three times a day (TID) | ORAL | Status: DC | PRN

## 2012-02-26 MED ORDER — HYDROMORPHONE HCL PF 1 MG/ML IJ SOLN
1.0000 mg | INTRAMUSCULAR | Status: DC | PRN
  Administered 2012-02-26 (×4): 2 mg via INTRAVENOUS
  Filled 2012-02-26 (×5): qty 2

## 2012-02-26 MED ORDER — ONDANSETRON HCL 4 MG/2ML IJ SOLN
4.0000 mg | Freq: Three times a day (TID) | INTRAMUSCULAR | Status: DC | PRN
  Administered 2012-02-26: 4 mg via INTRAVENOUS
  Filled 2012-02-26: qty 2

## 2012-02-26 MED ORDER — ONDANSETRON HCL 4 MG/2ML IJ SOLN
INTRAMUSCULAR | Status: AC
  Filled 2012-02-26: qty 2

## 2012-02-26 MED ORDER — SODIUM CHLORIDE 0.9 % IJ SOLN: 3.0000 mL | Freq: Two times a day (BID) | INTRAMUSCULAR | Status: DC

## 2012-02-26 NOTE — Progress Notes (Signed)
Patient being discharged with discharge and medication instructions. Patient given instructions about monitoring snake bite.

## 2012-02-26 NOTE — ED Notes (Signed)
Ankle Circ: 26cm Calf Circ: 35 cm Thigh Circ: 46cm  No pain. Pt is able to move toes. No tingling or numbness. No increased swelling or bruising. Brisk capillary refill.

## 2012-02-26 NOTE — H&P (Signed)
Seen and examined.  Discussed with Dr. Louanne Belton.  Agree with his management.  Briefly, 31 yo female snakebit at 8-9 pm 7/16.  Swelling and pain means obvious envenomation.  Only systemic complaint is mild nausea which occurred after beginning Rx with antivenom.  Leg is swollen to midcalf (bite on ankle), not red, very tender and good color.  Agree with management.  Will follow.

## 2012-02-26 NOTE — H&P (Signed)
Family Medicine Teaching [facility] Admission History and Physical Service Pager: [phone]  Patient name: [name] Medical record number: [id number] Date of birth: 06-20-1981 Age: 31 y.o. Gender: female  Primary Care Provider: Ailene Ravel, MD  Chief Complaint: Snake bite of the left lower extremity  Assessment and Plan: [name] is a 31 y.o. year old female presenting with snake bite of the left lower extremity 1. Snake bite: Due to the patient's significant swelling and involvement of a joint, CroFab administration has been begun. 1. Placed in observation 2. Check vascular status every hour 3. Measure leg hourly 4. Reassess 1 hour following CroFab to determine if needs additional dose (for worsening symptoms) 5. No evidence of DIC at this time, check INR/CBC/fibrinoge every 4 hours x12 hours 6. Pain control with IV hydromorphone 2. FEN/GI: Ad lib 3. Prophylaxis: SCD to right lower extremity 4. Disposition: Stepdown, obs status 5. Code Status: Full code  History of Present Illness: [name] is a 31 y.o. year old female presenting with Snake bite.  Happened at home around 8:30 pm on 7/16.  Location was on left ankle, in the achilles tendon area.  Immediate pain and swelling.  Came to ED subsequently.  In ED it was decided that patient was likely a copperhead bite and poison control was contacted.  Due to symptoms, CroFab was started and a call was made for admission for observation.  Patient admits to continued pain in LLE with swelling.  No other symptoms at this time.  Patient Active Problem List  Diagnosis  . Bite, snake, venomous   Past Medical History: History reviewed. No pertinent past medical history. Past Surgical History: Past Surgical History  Procedure Date  . Brain surgery   . Cesarean section    Social History: History  Substance Use Topics  . Smoking status: Never Smoker   . Smokeless tobacco: Not on file  . Alcohol Use: No    For any additional social history documentation, please refer to relevant sections of EMR.  Family History: No family history on file. Allergies: Allergies  Allergen Reactions  . Morphine And Related Nausea And Vomiting  . Vicodin (Hydrocodone-Acetaminophen) Other (See Comments)    "made clammy and white"   No current facility-administered medications on file prior to encounter.   No current outpatient prescriptions on file prior to encounter.   Review Of Systems: Per HPI with the following additions: No CP/SOB/N/V/D, bleeding, headaches, visual changes Otherwise 12 point review of systems was performed and was unremarkable.  Physical Exam: BP 142/75  Pulse 82  Resp 20  SpO2 100%  LMP 01/31/2012 Exam: General: NAD HEENT: MMM, PERRL, EOMI Cardiovascular: RRR, 3/6 systolic murmur Respiratory: CTABL Abdomen: SNTND, +BS Extremities: RLE is WNL.  LLE has evidence of bite mark with some dried blood in the region of the achilles tendon.  There is tenderness to palpation up to just below the knee.  There is some swelling to the level of the mid calf.  DP pulse is palpable.  There is good movement and good cap refill. Skin: No bruising currently Neuro: CN 2-12 intact, patient has slight left sided weakness in LUE, not tested LLE due to pain.  Otherwise 5+ strength.  Labs and Imaging: CBC BMET   Lab 02/25/12 2255  WBC 15.7*  HGB 7.9*  HCT 27.2*  PLT 292   No results found for this basename: NA,K,CL,CO2,BUN,CREATININE,GLUCOSE,CALCIUM in the last 168 hours  Fibrinogen: Normal INR: Normal (1.05) D-Dimer 0.33 Paytience Bures, MD  02/26/2012, 2:08 AM

## 2012-02-26 NOTE — ED Provider Notes (Signed)
History     CSN: [id number]  Arrival date & time 02/25/12  2227   First MD Initiated Contact with Patient 02/25/12 2253      Chief Complaint  Patient presents with  . Snake Bite    snake bite to achilles area of left foot at approx 2100 this PM; unable to identify snake; Fentanyl 100 mcg given IV per EMS PTA        (Consider location/radiation/quality/duration/timing/severity/associated sxs/prior treatment) The history is provided by the patient and a parent.   patient is a 31 year old female from the [facility] [place] area was bitten by a snake on the back of her left ankle at 8:50 PM. Patient had immediate pain in the area about 8-10 out of 10 swelling started in the foot and around the ankle of within 15 minutes. Patient's last tetanus was 8 years ago or longer. No history of snakebite in the past patient has not had an Timentin in the past.  History reviewed. No pertinent past medical history.  Past Surgical History  Procedure Date  . Brain surgery   . Cesarean section     No family history on file.  History  Substance Use Topics  . Smoking status: Never Smoker   . Smokeless tobacco: Not on file  . Alcohol Use: No    OB History    Grav Para Term Preterm Abortions TAB SAB Ect Mult Living                  Review of Systems  Constitutional: Negative for fever and chills.  HENT: Negative for nosebleeds and neck pain.   Eyes: Negative for visual disturbance.  Respiratory: Negative for shortness of breath.   Cardiovascular: Positive for leg swelling. Negative for chest pain.  Gastrointestinal: Negative for abdominal pain.  Genitourinary: Negative for dysuria.  Musculoskeletal: Negative for back pain.  Skin: Positive for wound. Negative for rash.  Neurological: Negative for headaches.  Hematological: Does not bruise/bleed easily.    Allergies  Morphine and related and Vicodin  Home Medications   Current Outpatient Rx  Name Route Sig Dispense Refill  .  NAPROXEN 250 MG PO TABS Oral Take 250 mg by mouth daily as needed. For pain      BP 123/84  Resp 20  SpO2 100%  LMP 01/31/2012  Physical Exam  Nursing note and vitals reviewed. Constitutional: She is oriented to person, place, and time. She appears well-developed and well-nourished. No distress.  HENT:  Head: Normocephalic and atraumatic.  Mouth/Throat: Oropharynx is clear and moist.  Eyes: Conjunctivae and EOM are normal. Pupils are equal, round, and reactive to light.  Neck: Normal range of motion. Neck supple.  Cardiovascular: Normal rate, regular rhythm, normal heart sounds and intact distal pulses.   No murmur heard. Pulmonary/Chest: Effort normal and breath sounds normal. No respiratory distress.  Abdominal: Soft. Bowel sounds are normal. There is no tenderness.  Musculoskeletal: Normal range of motion. She exhibits edema and tenderness.       [name] is normal except for the left lower treatment he was swelling of the foot swelling of the ankle to faint bite marks on the back of the key least tendon was swelling up to the half way point on the calf no knee swelling no knee pain pain goes up to just below the knee. Dorsalis pedis pulse is palpable 1+ good Refill sensation intact.  Neurological: She is alert and oriented to person, place, and time. No cranial nerve deficit. She exhibits  normal muscle tone. Coordination normal.  Skin: Skin is warm. No rash noted. She is not diaphoretic. No erythema.    ED Course  Procedures (including critical care time)  Labs Reviewed  CBC WITH DIFFERENTIAL - Abnormal; Notable for the following:    WBC 15.7 (*)     Hemoglobin 7.9 (*)     HCT 27.2 (*)     MCV 60.0 (*)     MCH 17.4 (*)     MCHC 29.0 (*)     RDW 18.7 (*)     Neutrophils Relative 83 (*)     Neutro Abs 13.0 (*)     All other components within normal limits  PROTIME-INR  APTT  FIBRINOGEN  CK  D-DIMER, QUANTITATIVE   No results found. Results for orders placed during the  hospital encounter of 02/25/12  CBC WITH DIFFERENTIAL      Component Value Range   WBC 15.7 (*) 4.0 - 10.5 K/uL   RBC 4.53  3.87 - 5.11 MIL/uL   Hemoglobin 7.9 (*) 12.0 - 15.0 g/dL   HCT 78.2 (*) 95.6 - 21.3 %   MCV 60.0 (*) 78.0 - 100.0 fL   MCH 17.4 (*) 26.0 - 34.0 pg   MCHC 29.0 (*) 30.0 - 36.0 g/dL   RDW 08.6 (*) 57.8 - 46.9 %   Platelets 292  150 - 400 K/uL   Neutrophils Relative 83 (*) 43 - 77 %   Lymphocytes Relative 12  12 - 46 %   Monocytes Relative 5  3 - 12 %   Eosinophils Relative 0  0 - 5 %   Basophils Relative 0  0 - 1 %   Neutro Abs 13.0 (*) 1.7 - 7.7 K/uL   Lymphs Abs 1.9  0.7 - 4.0 K/uL   Monocytes Absolute 0.8  0.1 - 1.0 K/uL   Eosinophils Absolute 0.0  0.0 - 0.7 K/uL   Basophils Absolute 0.0  0.0 - 0.1 K/uL   RBC Morphology ELLIPTOCYTES    PROTIME-INR      Component Value Range   Prothrombin Time 13.9  11.6 - 15.2 seconds   INR 1.05  0.00 - 1.49  APTT      Component Value Range   aPTT 27  24 - 37 seconds  FIBRINOGEN      Component Value Range   Fibrinogen 287  204 - 475 mg/dL  CK      Component Value Range   Total CK 119  7 - 177 U/L  D-DIMER, QUANTITATIVE      Component Value Range   D-Dimer, Quant 0.33  0.00 - 0.48 ug/mL-FEU     1. Venomous snake bite    CRITICAL CARE Performed by: Shelda Jakes.   Total critical care time: 45  Critical care time was exclusive of separately billable procedures and treating other patients.  Critical care was necessary to treat or prevent imminent or life-threatening deterioration.  Critical care was time spent personally by me on the following activities: development of treatment plan with patient and/or surrogate as well as nursing, discussions with consultants, evaluation of patient's response to treatment, examination of patient, obtaining history from patient or surrogate, ordering and performing treatments and interventions, ordering and review of laboratory studies, ordering and review of  radiographic studies, pulse oximetry and re-evaluation of patient's condition.   MDM   Patient status post venomous snakebite to the back of her left ankle along the Achilles tendon snakebite occurred at 8:50 PM by the time  she arrived here she had swelling half way up the calf I contacted [place] poison control they recommended  CroFab was ordered 4 vials being given now. The swelling hasn't progressed beyond the half. Pain is not progressed beyond just below the knee. No systemic symptoms. Patient given IV fluids also updated with her tetanus and had any tetanus musician and 8 years.   Contacted the undersigned admission of the family medicine they're coming down to get her admitted patient's leg has been elevated since she got here IV fluids and given in base labs were ordered to include PT fibrinogen d-dimer and CBC also include basic electrolytes and CPK.  CroFab is now ongoing patient not showing any signs of reaction.       Shelda Jakes, MD 02/26/12 [phone]

## 2012-02-26 NOTE — Progress Notes (Signed)
FPTS PGY-1 Update Note  Pt seen at bedside this morning and at 1420. Pain in foot/ankle is slowly getting a little better; pt able on exam to flex knee with less difficulty, wiggle toes and move ankle slowly. Swelling in calf slowly diminishing. BP remains stable, some nausea remains.  Labs continue to be stable (INR, platelets, fibrinogen).  Plan now is to check one more set of labs around 1600 and evaluate continued resolving pain/swelling, then discharge late this afternoon or early tomorrow morning (pt has an outpt neurology appointment she wishes to keep, and she appears to be hemodynamically stable with improving pain/swelling). Zofran PRN.  Bobbye Morton, MD PGY-1, [facility] Family Medicine FPTS Intern pager: [phone]

## 2012-02-26 NOTE — ED Notes (Signed)
Angela Adam (mom) [phone] Leanora Cover (Dad) [phone]

## 2012-02-26 NOTE — ED Provider Notes (Deleted)
History     CSN: [id number]  Arrival date & time 02/25/12  2227   First MD Initiated Contact with Patient 02/25/12 2253      Chief Complaint  Patient presents with  . Snake Bite    snake bite to achilles area of left foot at approx 2100 this PM; unable to identify snake; Fentanyl 100 mcg given IV per EMS PTA        (Consider location/radiation/quality/duration/timing/severity/associated sxs/prior treatment) HPI Patient presents emergency department following a snake bite that occurred around 9 PM.  Patient, states that she was walking in her yard and when she felt something bite the back of her leg.  She saw what appeared to be a brown snake with black diamonds.  Patient denies chest pain, shortness of breath, numbness, weakness, visual changes, fever, palpitations or abdominal pain.  Patient has no nausea or vomiting. History reviewed. No pertinent past medical history.  Past Surgical History  Procedure Date  . Brain surgery   . Cesarean section     No family history on file.  History  Substance Use Topics  . Smoking status: Never Smoker   . Smokeless tobacco: Not on file  . Alcohol Use: No    OB History    Grav Para Term Preterm Abortions TAB SAB Ect Mult Living                  Review of Systems All other systems negative except as documented in the HPI. All pertinent positives and negatives as reviewed in the HPI.  Allergies  Morphine and related and Vicodin  Home Medications   Current Outpatient Rx  Name Route Sig Dispense Refill  . NAPROXEN 250 MG PO TABS Oral Take 250 mg by mouth daily as needed. For pain      BP 123/84  Resp 20  SpO2 100%  LMP 01/31/2012  Physical Exam  Nursing note and vitals reviewed. Constitutional: She appears well-developed and well-nourished.  HENT:  Head: Normocephalic and atraumatic.  Mouth/Throat: Oropharynx is clear and moist.  Cardiovascular: Normal rate and regular rhythm.   Pulmonary/Chest: Effort normal and  breath sounds normal.  Musculoskeletal:       Left lower leg: She exhibits tenderness and swelling.       Legs: Skin: Skin is warm and dry.    ED Course  Procedures (including critical care time)  Labs Reviewed  CBC WITH DIFFERENTIAL - Abnormal; Notable for the following:    WBC 15.7 (*)     Hemoglobin 7.9 (*)     HCT 27.2 (*)     MCV 60.0 (*)     MCH 17.4 (*)     MCHC 29.0 (*)     RDW 18.7 (*)     Neutrophils Relative 83 (*)     Neutro Abs 13.0 (*)     All other components within normal limits  PROTIME-INR  APTT  FIBRINOGEN  CK  D-DIMER, QUANTITATIVE   Poison control is called and apprised of the patient's condition.  Since the rapid spread of the swelling she will need CroFab.  Patient has been given pain control and lab testing is in progress   MDM          Carlyle Dolly, PA-C 02/26/12 0130

## 2012-02-26 NOTE — ED Notes (Signed)
No SOB, No CP, No itching No n/v. No signs of allergic reaction on medication. Vitals WNL. Will continue to monitor.

## 2012-02-26 NOTE — ED Notes (Signed)
Transported by myself on heart monitor.

## 2012-02-26 NOTE — ED Notes (Addendum)
Ankle Circ: 25cm Calf: 38cm Thigh: 45 cm  Pt now able to slightly bend foot at ankle. No pain. No tingling or numbness. Capillary refill brisk. Leg warm to touch. Will continue to monitor.

## 2012-02-26 NOTE — ED Notes (Signed)
Increased medication to 250 per pharmacy orders.

## 2012-02-26 NOTE — ED Notes (Signed)
Ankle circumfrence: 26cm Calf Circumfrence: 39cm Thigh Circurfrence: 47.5  No additional bruising noted. Pt continues to be able to move toes and feels sensation. No numbness.  Pain level has decreased. Will continue to monitor.

## 2012-02-29 NOTE — Discharge Summary (Signed)
Seen and examined on the day of discharge.  Agree with Dr. Paulina Fusi documentation except DC date was 02/26/12

## 2012-02-29 NOTE — Discharge Summary (Signed)
Physician Discharge Summary  Patient ID: [name] MRN: [id number] DOB: Jun 06, 1981 Age: 31 y.o.  Admit date: 02/25/2012 Discharge date: 02/29/2012 Admitting Physician: Sanjuana Letters, MD  PCP: Ailene Ravel, MD  Consultants:None     Discharge Diagnosis: Principal Problem:  *Bite, snake, venomous    Hospital Course [name] is a 31 y.o. year old female presenting with snake bite of the left lower extremity   1. Snake bite: Due to the patient's significant swelling and involvement of a joint, CroFab administration was started.  She was bite by a copperhead and according to the literature,  1. Pt was placed in observation and had her vascular status along with site of wound inspected every hour.  She was able to wiggle her toes and move her ankle.  During her stay, her BP remained stable, and her labs including INR, platelets, fibrinogen did not show to be abnormal significant for possibly DIC secondary to bite.  These labs were checked x 2 q 4 hrs and pain was controlled with dilaudid PRN.  At time of d/c pt was stable and c/o less pain        Discharge PE   Filed Vitals:   02/26/12 1800  BP: 135/67  Pulse: 86  Temp:   Resp:    General: NAD  HEENT: MMM, PERRL, EOMI  Cardiovascular: RRR, 3/6 systolic murmur  Respiratory: CTABL  Abdomen: SNTND, +BS  Extremities: RLE is WNL. LLE has evidence of bite mark with some dried blood in the region of the achilles tendon. There is tenderness to palpation up to just below the knee. There is some swelling to the level of the mid calf. DP pulse is palpable. There is good movement and good cap refill.  Skin: No bruising currently  Neuro: CN 2-12 intact, patient has slight left sided weakness in LUE, not tested LLE due to pain. Otherwise 5+ strength.      Procedures/Imaging:   Labs  CBC  Lab 02/26/12 0840 02/25/12 2255  WBC 16.4* 15.7*  HGB 7.9* 7.9*  HCT 27.8* 27.2*  PLT 277 292   BMET No results found for  this basename: NA:3,K:3,CL:3,CO2:3,BUN:3,CREATININE:3,CALCIUM:3,LABALBU:3,PROT:3,BILITOT:3,ALKPHOS:3,ALT:3,AST:3,GLUCOSE:3 in the last 168 hours Results for orders placed during the hospital encounter of 02/25/12 (from the past 72 hour(s))  PROTIME-INR     Status: Normal   Collection Time   02/26/12 12:25 PM      Component Value Range Comment   Prothrombin Time 14.6  11.6 - 15.2 seconds    INR 1.12  0.00 - 1.49   FIBRINOGEN     Status: Normal   Collection Time   02/26/12 12:25 PM      Component Value Range Comment   Fibrinogen 299  204 - 475 mg/dL   PROTIME-INR     Status: Normal   Collection Time   02/26/12  3:46 PM      Component Value Range Comment   Prothrombin Time 14.8  11.6 - 15.2 seconds    INR 1.14  0.00 - 1.49   FIBRINOGEN     Status: Normal   Collection Time   02/26/12  3:46 PM      Component Value Range Comment   Fibrinogen 306  204 - 475 mg/dL        Patient condition at time of discharge/disposition: stable  Disposition-home   Follow up issues: 1. Please follow up on pt's wound from snake bite.  As long as the site is not showing signs of infection, this  should heal well.  Discharge follow up:  Follow-up Information    Schedule an appointment as soon as possible for a visit with [facility] L, MD. (Please make an appointment for Friday July 19.)    Contact information:   Dr. Burnell Blanks [address] [place] [address] [phone]          Discharge Instructions: Please refer to Patient Instructions section of EMR for full details.  Patient was counseled important signs and symptoms that should prompt return to medical care, changes in medications, dietary instructions, activity restrictions, and follow up appointments.  Significant instructions noted below:  Discharge Orders    Future Orders Please Complete By Expires   Diet - low sodium heart healthy      Increase activity slowly      Call MD for:  temperature >100.4       Call MD for:  persistant nausea and vomiting      Call MD for:  severe uncontrolled pain      Call MD for:  redness, tenderness, or signs of infection (pain, swelling, redness, odor or green/yellow discharge around incision site)      Call MD for:  difficulty breathing, headache or visual disturbances      Call MD for:  persistant dizziness or light-headedness          Discharge Medications Medication List  As of 02/29/2012 11:20 AM   START taking these medications         ondansetron 4 MG tablet   Commonly known as: ZOFRAN   Take 1-2 tablets (4-8 mg total) by mouth every 6 (six) hours as needed for nausea.      oxyCODONE-acetaminophen 5-325 MG per tablet   Commonly known as: PERCOCET/ROXICET   Take 1-2 tablets by mouth every 6 (six) hours as needed for pain.         CONTINUE taking these medications         naproxen 250 MG tablet   Commonly known as: NAPROSYN          Where to get your medications    These are the prescriptions that you need to pick up. We sent them to a specific pharmacy, so you will need to go there to get them.   [id number] - [place], [place] - [address] [facility]    [address] [place] [address]    Phone: [phone]        ondansetron 4 MG tablet         You may get these medications from any pharmacy.         oxyCODONE-acetaminophen 5-325 MG per tablet                Gildardo Cranker, DO of [facility] 02/29/2012 11:20 AM

## 2014-04-27 IMAGING — CT CT HEAD W/O CM
1 of 2 series · 13 of 30 positions shown, 17 images · non-contrast
Comparison: 05/16/2004

CLINICAL DATA: Right sided headache, dizziness, prior brain surgery

CT HEAD WITHOUT CONTRAST
TECHNIQUE: Contiguous axial images were obtained from the base of
the skull through the vertex without contrast.

[Series 2: brain · axial · 0.47mm/px · z∈[+129,+249]mm · 13 of 28 slices shown, 17 images]
[im 2/28  brain]
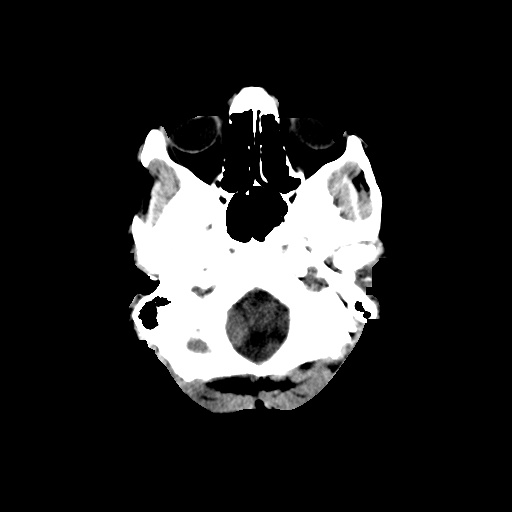
[im 2/28  bone]
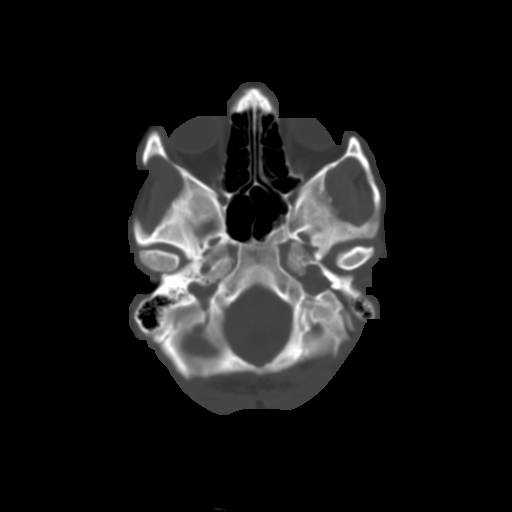
[im 4/28  brain]
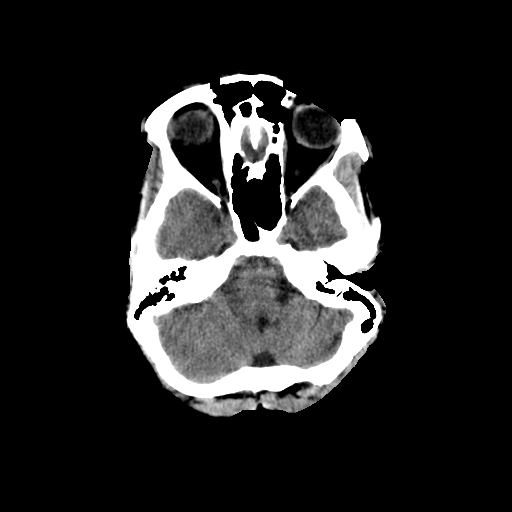
[im 6/28  brain]
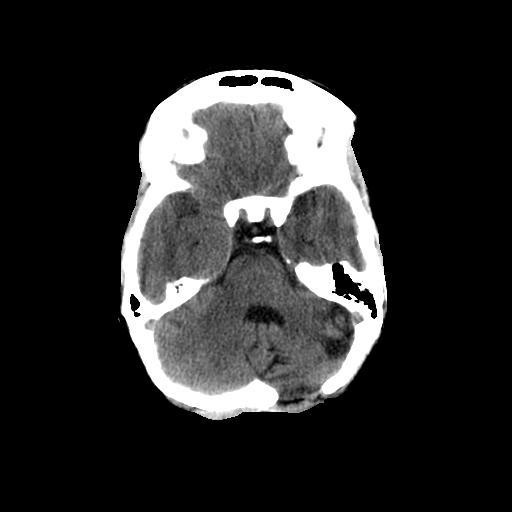
[im 8/28  brain]
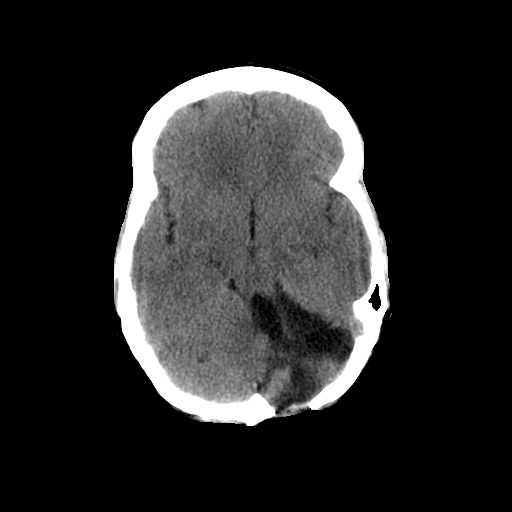
[im 10/28  brain]
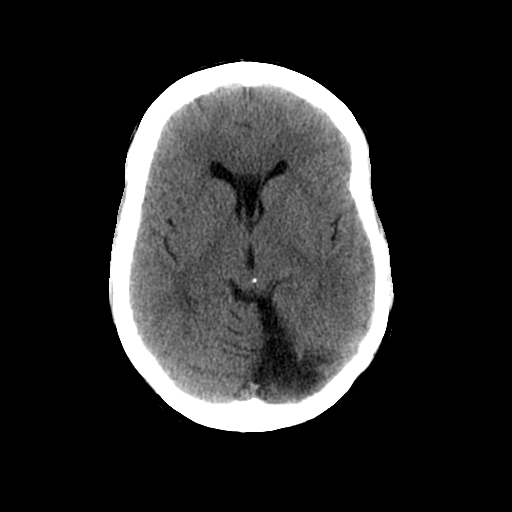
[im 10/28  bone]
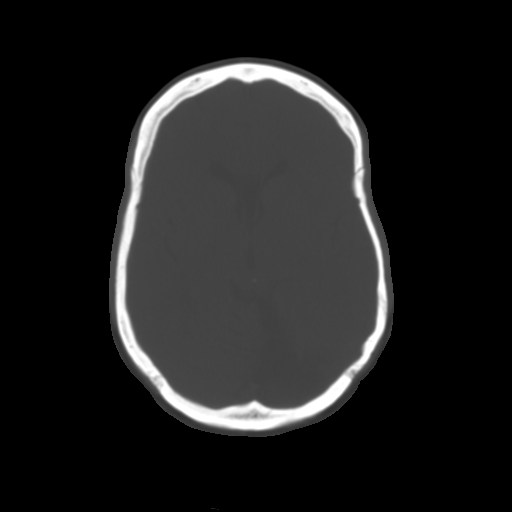
[im 12/28  brain]
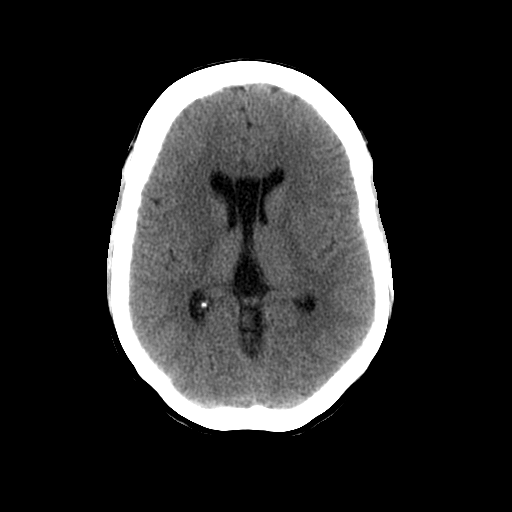
[im 14/28  brain]
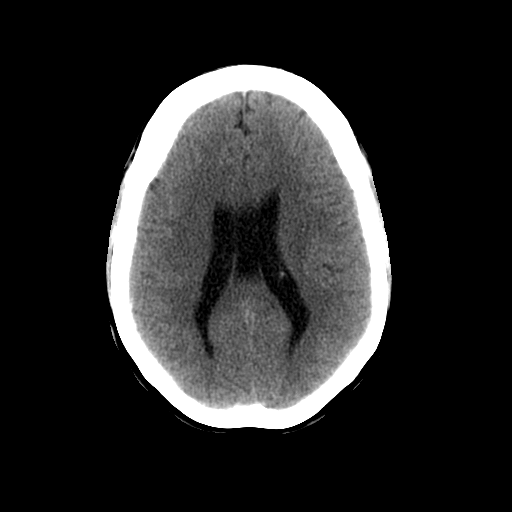
[im 16/28  brain]
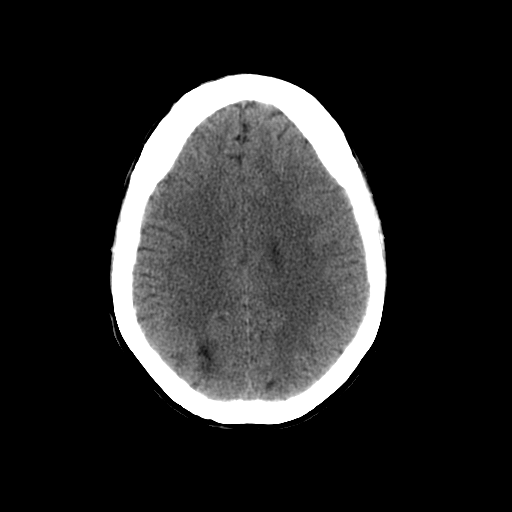
[im 18/28  brain]
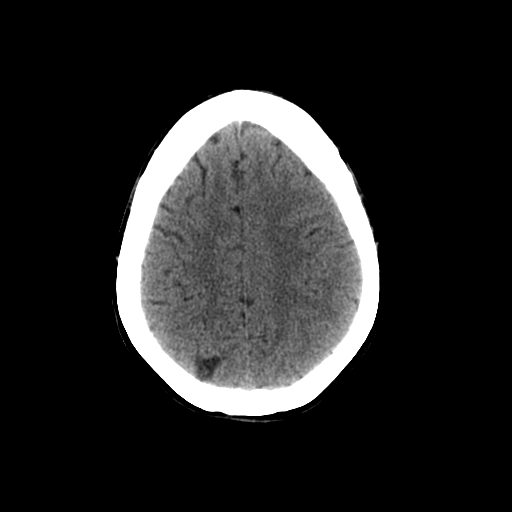
[im 18/28  bone]
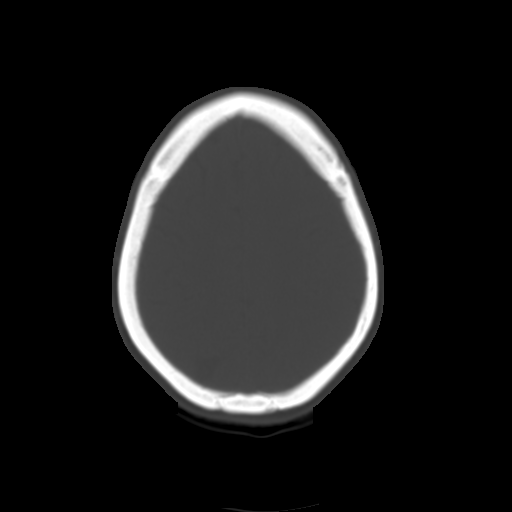
[im 20/28  brain]
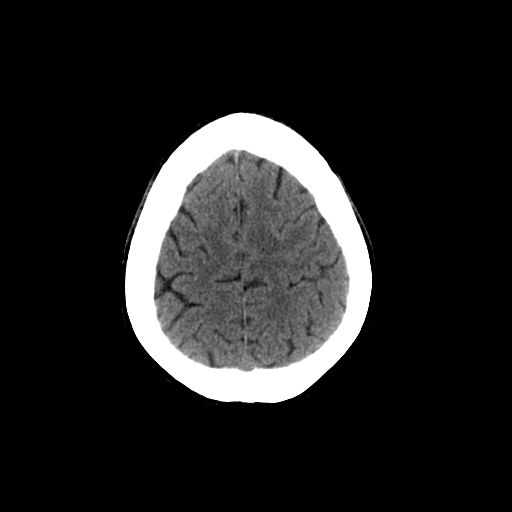
[im 22/28  brain]
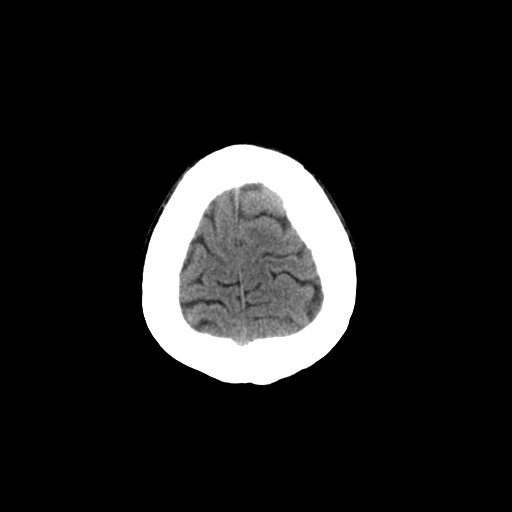
[im 24/28  brain]
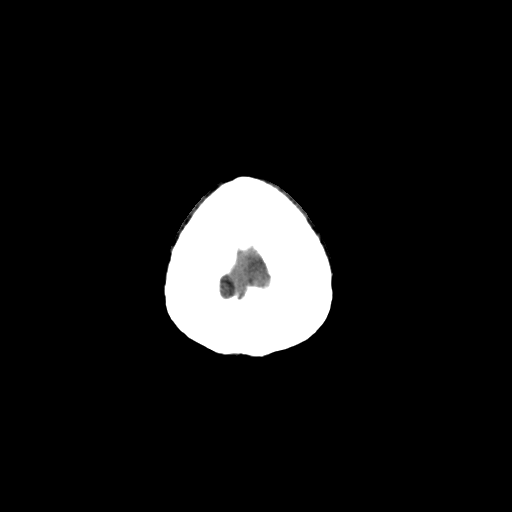
[im 26/28  brain]
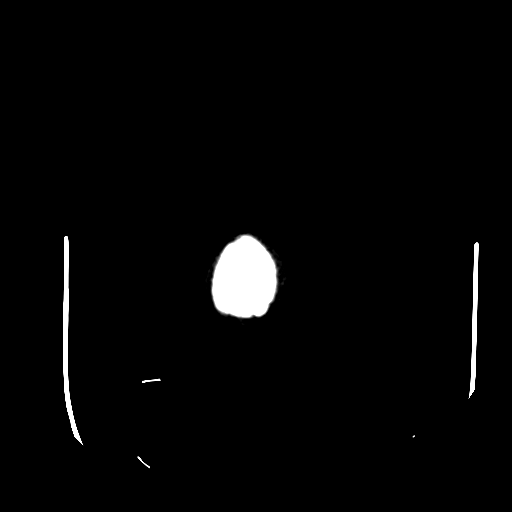
[im 26/28  bone]
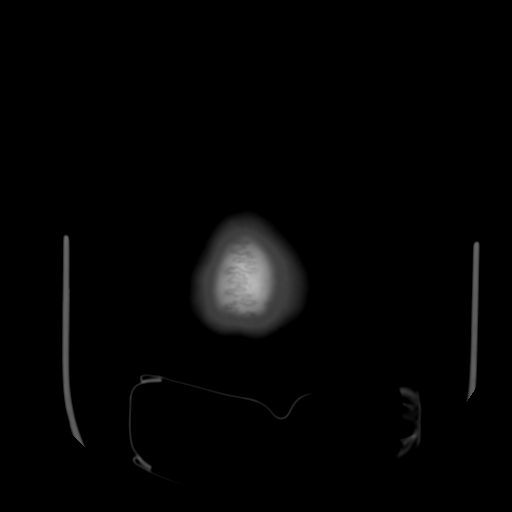

[13 of 30 positions shown; findings below may reference images not displayed]

FINDINGS: Encephalomalacic changes in the left cerebellum (series
2/image 8) and posterior right parietal lobe (series 2/image 18).
Additional suspected prior infarct in the right cerebellum (series
2/image 5).

Postsurgical changes at the left skull base.

No evidence of parenchymal hemorrhage or extra-axial fluid
collection. No mass lesion, mass effect, or midline shift.

No CT evidence of acute infarction.

Cerebral volume is age appropriate.  No ventriculomegaly.  Septum
cavum pellucidum.

The visualized paranasal sinuses are essentially clear. The mastoid
air cells are unopacified.

No evidence of calvarial fracture.
IMPRESSION: Encephalomalacic changes related to prior infarcts.  Postsurgical
changes at the left skull base.

No evidence of acute intracranial abnormality.

## 2019-03-12 DIAGNOSIS — T7840XA Allergy, unspecified, initial encounter: Secondary | ICD-10-CM | POA: Diagnosis not present

## 2019-03-18 DIAGNOSIS — Z1331 Encounter for screening for depression: Secondary | ICD-10-CM | POA: Diagnosis not present

## 2019-03-18 DIAGNOSIS — Z6835 Body mass index (BMI) 35.0-35.9, adult: Secondary | ICD-10-CM | POA: Diagnosis not present

## 2019-03-18 DIAGNOSIS — T7840XS Allergy, unspecified, sequela: Secondary | ICD-10-CM | POA: Diagnosis not present

## 2019-03-25 ENCOUNTER — Other Ambulatory Visit: Payer: Self-pay

## 2019-03-25 ENCOUNTER — Ambulatory Visit (INDEPENDENT_AMBULATORY_CARE_PROVIDER_SITE_OTHER): Payer: BC Managed Care – PPO | Admitting: Allergy and Immunology

## 2019-03-25 ENCOUNTER — Encounter: Payer: Self-pay | Admitting: Allergy and Immunology

## 2019-03-25 VITALS — BP 158/80 | HR 98 | Temp 98.3°F | Resp 18 | Ht 60.5 in | Wt 189.8 lb

## 2019-03-25 DIAGNOSIS — T7840XD Allergy, unspecified, subsequent encounter: Secondary | ICD-10-CM | POA: Diagnosis not present

## 2019-03-25 DIAGNOSIS — R011 Cardiac murmur, unspecified: Secondary | ICD-10-CM
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
## 2019-03-25 NOTE — Patient Instructions (Addendum)
  1.  Contact clinic if recurrent allergic reactions  2.  Obtain a 2D echo with Doppler for murmur

## 2019-03-29 ENCOUNTER — Encounter: Payer: Self-pay | Admitting: Allergy and Immunology

## 2019-04-13 ENCOUNTER — Telehealth: Payer: Self-pay | Admitting: Allergy and Immunology

## 2019-04-13 NOTE — Telephone Encounter (Signed)
Called patient's insurance to initiate [name] and they stated her insurance did not cover echo's. They would not give me a reason other then "her insurance plan does not cover echo's." Would you like for me to call and ask the patient to call her insurance to see if they can explain to her what needs to be done to get this approved, or see if she is given the same answer? Please advise.

## 2019-04-13 NOTE — Telephone Encounter (Signed)
See below

## 2019-04-13 NOTE — Telephone Encounter (Signed)
Patient stated she will contact her insurance to see the reason why she cannot get the Echo study done. She is aware that her insurance has caused problems in the past and will follow up on this. She will give [place] a call back regarding this situation and if she cannot get it approved through the insurance she would like to know how much it is going to cost out of pocket.

## 2019-04-13 NOTE — Telephone Encounter (Signed)
First, I do not believe that the explanation you were given is correct. Maybe, but very unusual. Second, inform the patient that her insurance company is giving [place] a hard time and can she please call her insurance comapany and find out what is going on.

## 2019-04-13 NOTE — Telephone Encounter (Signed)
[name] called and requested an update to her referral for a 2D Echo w/ Doppler for her murmur.  She was seen on 03/25/2019.  She would like a call back.

## 2019-04-15 NOTE — Telephone Encounter (Signed)
Spoke with patient's insurance and got everything straightened out. It wasn't that the echo wasn't covered, it was because her insurance [place] "[place] of [place]" and we were speaking to the wrong "PA team." Nonetheless, PA wasn't required for Echo 2D with doppler, so I was able to get patient scheduled with [facility] for the echo next week. Patient informed of date and time of study.

## 2019-04-20 DIAGNOSIS — R011 Cardiac murmur, unspecified: Secondary | ICD-10-CM | POA: Diagnosis not present

## 2019-04-20 DIAGNOSIS — I340 Nonrheumatic mitral (valve) insufficiency: Secondary | ICD-10-CM | POA: Diagnosis not present

## 2019-04-22 DIAGNOSIS — Z6835 Body mass index (BMI) 35.0-35.9, adult: Secondary | ICD-10-CM | POA: Diagnosis not present

## 2019-04-22 DIAGNOSIS — Z13220 Encounter for screening for lipoid disorders: Secondary | ICD-10-CM | POA: Diagnosis not present

## 2019-04-22 DIAGNOSIS — Z01419 Encounter for gynecological examination (general) (routine) without abnormal findings: Secondary | ICD-10-CM | POA: Diagnosis not present

## 2019-04-22 DIAGNOSIS — E669 Obesity, unspecified: Secondary | ICD-10-CM | POA: Diagnosis not present

## 2019-04-22 DIAGNOSIS — Z01411 Encounter for gynecological examination (general) (routine) with abnormal findings: Secondary | ICD-10-CM | POA: Diagnosis not present

## 2019-04-22 DIAGNOSIS — D509 Iron deficiency anemia, unspecified: Secondary | ICD-10-CM | POA: Diagnosis not present

## 2019-04-22 DIAGNOSIS — Z23 Encounter for immunization: Secondary | ICD-10-CM | POA: Diagnosis not present

## 2019-04-22 DIAGNOSIS — R030 Elevated blood-pressure reading, without diagnosis of hypertension: Secondary | ICD-10-CM | POA: Diagnosis not present

## 2019-05-05 ENCOUNTER — Telehealth: Payer: Self-pay

## 2019-05-05 NOTE — Telephone Encounter (Signed)
Please inform patient that Dr.Kozlow received her ECHO and it shows mild mitral regurgitation, which does not require any further evaluation at this point per Dr.Kozlow. Results will be scanned into EPIC

## 2019-05-06 NOTE — Telephone Encounter (Signed)
Patient informed of results.  

## 2019-05-06 NOTE — Telephone Encounter (Signed)
Left message for patient to call the office

## 2019-05-17 ENCOUNTER — Encounter: Payer: Self-pay | Admitting: Allergy and Immunology

## 2019-07-22 DIAGNOSIS — E669 Obesity, unspecified: Secondary | ICD-10-CM | POA: Diagnosis not present

## 2019-07-22 DIAGNOSIS — D509 Iron deficiency anemia, unspecified: Secondary | ICD-10-CM | POA: Diagnosis not present

## 2019-07-22 DIAGNOSIS — N920 Excessive and frequent menstruation with regular cycle: Secondary | ICD-10-CM | POA: Diagnosis not present

## 2019-07-22 DIAGNOSIS — R011 Cardiac murmur, unspecified: Secondary | ICD-10-CM | POA: Diagnosis not present

## 2019-08-03 DIAGNOSIS — D649 Anemia, unspecified: Secondary | ICD-10-CM | POA: Diagnosis not present

## 2019-08-03 DIAGNOSIS — D509 Iron deficiency anemia, unspecified: Secondary | ICD-10-CM | POA: Diagnosis not present

## 2019-08-11 DIAGNOSIS — D509 Iron deficiency anemia, unspecified: Secondary | ICD-10-CM | POA: Diagnosis not present

## 2019-08-18 DIAGNOSIS — D509 Iron deficiency anemia, unspecified: Secondary | ICD-10-CM | POA: Diagnosis not present

## 2019-08-27 DIAGNOSIS — N921 Excessive and frequent menstruation with irregular cycle: Secondary | ICD-10-CM | POA: Diagnosis not present

## 2019-10-12 DIAGNOSIS — N921 Excessive and frequent menstruation with irregular cycle: Secondary | ICD-10-CM | POA: Diagnosis not present

## 2019-10-15 DIAGNOSIS — Z01818 Encounter for other preprocedural examination: Secondary | ICD-10-CM | POA: Diagnosis not present

## 2019-10-15 DIAGNOSIS — Z6835 Body mass index (BMI) 35.0-35.9, adult: Secondary | ICD-10-CM | POA: Diagnosis not present

## 2019-11-02 DIAGNOSIS — D509 Iron deficiency anemia, unspecified: Secondary | ICD-10-CM | POA: Diagnosis not present

## 2019-11-02 DIAGNOSIS — D500 Iron deficiency anemia secondary to blood loss (chronic): Secondary | ICD-10-CM | POA: Diagnosis not present

## 2019-11-17 DIAGNOSIS — Z01818 Encounter for other preprocedural examination: Secondary | ICD-10-CM | POA: Diagnosis not present

## 2019-11-17 DIAGNOSIS — N921 Excessive and frequent menstruation with irregular cycle: Secondary | ICD-10-CM | POA: Diagnosis not present

## 2019-11-23 DIAGNOSIS — N921 Excessive and frequent menstruation with irregular cycle: Secondary | ICD-10-CM | POA: Diagnosis not present

## 2019-11-23 DIAGNOSIS — Z20822 Contact with and (suspected) exposure to covid-19: Secondary | ICD-10-CM | POA: Diagnosis not present

## 2019-11-23 DIAGNOSIS — Z01812 Encounter for preprocedural laboratory examination: Secondary | ICD-10-CM | POA: Diagnosis not present

## 2019-11-30 DIAGNOSIS — N736 Female pelvic peritoneal adhesions (postinfective): Secondary | ICD-10-CM | POA: Diagnosis not present

## 2019-11-30 DIAGNOSIS — K660 Peritoneal adhesions (postprocedural) (postinfection): Secondary | ICD-10-CM | POA: Diagnosis not present

## 2019-11-30 DIAGNOSIS — D649 Anemia, unspecified: Secondary | ICD-10-CM | POA: Diagnosis not present

## 2019-11-30 DIAGNOSIS — Z8673 Personal history of transient ischemic attack (TIA), and cerebral infarction without residual deficits: Secondary | ICD-10-CM | POA: Diagnosis not present

## 2019-11-30 DIAGNOSIS — N921 Excessive and frequent menstruation with irregular cycle: Secondary | ICD-10-CM | POA: Diagnosis not present

## 2023-01-09 DIAGNOSIS — R079 Chest pain, unspecified: Secondary | ICD-10-CM | POA: Diagnosis not present

## 2023-01-09 DIAGNOSIS — R0789 Other chest pain: Secondary | ICD-10-CM | POA: Diagnosis not present

## 2023-01-09 DIAGNOSIS — I10 Essential (primary) hypertension: Secondary | ICD-10-CM | POA: Diagnosis not present

## 2023-01-12 DIAGNOSIS — R0789 Other chest pain: Secondary | ICD-10-CM | POA: Diagnosis not present

## 2023-05-07 DIAGNOSIS — Z8742 Personal history of other diseases of the female genital tract: Secondary | ICD-10-CM | POA: Diagnosis not present

## 2023-05-07 DIAGNOSIS — Z8673 Personal history of transient ischemic attack (TIA), and cerebral infarction without residual deficits: Secondary | ICD-10-CM | POA: Diagnosis not present

## 2023-05-07 DIAGNOSIS — Z1331 Encounter for screening for depression: Secondary | ICD-10-CM | POA: Diagnosis not present

## 2023-05-07 DIAGNOSIS — M25512 Pain in left shoulder: Secondary | ICD-10-CM | POA: Diagnosis not present

## 2023-05-07 DIAGNOSIS — Z862 Personal history of diseases of the blood and blood-forming organs and certain disorders involving the immune mechanism: Secondary | ICD-10-CM | POA: Diagnosis not present

## 2023-06-18 DIAGNOSIS — Z83438 Family history of other disorder of lipoprotein metabolism and other lipidemia: Secondary | ICD-10-CM | POA: Diagnosis not present

## 2023-06-18 DIAGNOSIS — Z01419 Encounter for gynecological examination (general) (routine) without abnormal findings: Secondary | ICD-10-CM | POA: Diagnosis not present

## 2023-06-18 DIAGNOSIS — D649 Anemia, unspecified: Secondary | ICD-10-CM | POA: Diagnosis not present

## 2023-06-18 DIAGNOSIS — E559 Vitamin D deficiency, unspecified: Secondary | ICD-10-CM | POA: Diagnosis not present

## 2023-07-15 DIAGNOSIS — N6321 Unspecified lump in the left breast, upper outer quadrant: Secondary | ICD-10-CM | POA: Diagnosis not present

## 2023-09-24 DIAGNOSIS — E559 Vitamin D deficiency, unspecified: Secondary | ICD-10-CM | POA: Diagnosis not present

## 2023-09-24 DIAGNOSIS — R790 Abnormal level of blood mineral: Secondary | ICD-10-CM | POA: Diagnosis not present

## 2024-03-24 DIAGNOSIS — S46912A Strain of unspecified muscle, fascia and tendon at shoulder and upper arm level, left arm, initial encounter: Secondary | ICD-10-CM | POA: Diagnosis not present

## 2024-03-24 DIAGNOSIS — Z8673 Personal history of transient ischemic attack (TIA), and cerebral infarction without residual deficits: Secondary | ICD-10-CM | POA: Diagnosis not present

## 2024-03-24 DIAGNOSIS — E66811 Obesity, class 1: Secondary | ICD-10-CM | POA: Diagnosis not present

## 2024-03-24 DIAGNOSIS — Z6830 Body mass index (BMI) 30.0-30.9, adult: Secondary | ICD-10-CM | POA: Diagnosis not present
